# Patient Record
Sex: Female | Born: 1960 | Race: White | Hispanic: No | Marital: Single | Smoking: Former smoker
Health system: Southern US, Community
[De-identification: ages and names within clinical notes are randomized; demographics above are authoritative.]

## PROBLEM LIST (undated history)

## (undated) DIAGNOSIS — R112 Nausea with vomiting, unspecified: Secondary | ICD-10-CM

## (undated) DIAGNOSIS — N200 Calculus of kidney: Secondary | ICD-10-CM

## (undated) DIAGNOSIS — T7840XA Allergy, unspecified, initial encounter: Secondary | ICD-10-CM

## (undated) DIAGNOSIS — Z9889 Other specified postprocedural states: Secondary | ICD-10-CM

## (undated) DIAGNOSIS — F419 Anxiety disorder, unspecified: Secondary | ICD-10-CM

## (undated) DIAGNOSIS — I10 Essential (primary) hypertension: Secondary | ICD-10-CM

## (undated) DIAGNOSIS — I429 Cardiomyopathy, unspecified: Secondary | ICD-10-CM

## (undated) HISTORY — DX: Anxiety disorder, unspecified: F41.9

## (undated) HISTORY — PX: APPENDECTOMY: SHX54

## (undated) HISTORY — DX: Allergy, unspecified, initial encounter: T78.40XA

---

## 2014-02-17 ENCOUNTER — Emergency Department: Payer: Self-pay | Admitting: Emergency Medicine

## 2014-02-17 LAB — CBC
HCT: 41.5 % (ref 35.0–47.0)
HGB: 14.1 g/dL (ref 12.0–16.0)
MCH: 33.2 pg (ref 26.0–34.0)
MCHC: 34 g/dL (ref 32.0–36.0)
MCV: 98 fL (ref 80–100)
Platelet: 232 10*3/uL (ref 150–440)
RBC: 4.25 10*6/uL (ref 3.80–5.20)
RDW: 11.7 % (ref 11.5–14.5)
WBC: 13.7 10*3/uL — ABNORMAL HIGH (ref 3.6–11.0)

## 2014-02-17 LAB — COMPREHENSIVE METABOLIC PANEL
ALBUMIN: 4.3 g/dL (ref 3.4–5.0)
Alkaline Phosphatase: 59 U/L
Anion Gap: 1 — ABNORMAL LOW (ref 7–16)
BILIRUBIN TOTAL: 0.5 mg/dL (ref 0.2–1.0)
BUN: 11 mg/dL (ref 7–18)
CHLORIDE: 105 mmol/L (ref 98–107)
CO2: 32 mmol/L (ref 21–32)
Calcium, Total: 9.4 mg/dL (ref 8.5–10.1)
Creatinine: 0.87 mg/dL (ref 0.60–1.30)
EGFR (Non-African Amer.): >60
Glucose: 104 mg/dL — ABNORMAL HIGH (ref 65–99)
OSMOLALITY: 275 (ref 275–301)
Potassium: 3.5 mmol/L (ref 3.5–5.1)
SGOT(AST): 28 U/L (ref 15–37)
SGPT (ALT): 19 U/L (ref 12–78)
Sodium: 138 mmol/L (ref 136–145)
TOTAL PROTEIN: 7.8 g/dL (ref 6.4–8.2)

## 2014-02-17 LAB — URINALYSIS, COMPLETE
Bacteria: NONE SEEN
Bilirubin,UR: NEGATIVE
GLUCOSE, UR: NEGATIVE mg/dL (ref 0–75)
Ketone: NEGATIVE
NITRITE: NEGATIVE
PH: 6 (ref 4.5–8.0)
Protein: 100
SPECIFIC GRAVITY: 1.011 (ref 1.003–1.030)
Squamous Epithelial: NONE SEEN

## 2014-02-20 LAB — URINE CULTURE

## 2014-03-30 DIAGNOSIS — I429 Cardiomyopathy, unspecified: Secondary | ICD-10-CM | POA: Insufficient documentation

## 2014-05-23 ENCOUNTER — Ambulatory Visit: Payer: Self-pay | Admitting: Urology

## 2014-06-06 DIAGNOSIS — C649 Malignant neoplasm of unspecified kidney, except renal pelvis: Secondary | ICD-10-CM | POA: Insufficient documentation

## 2015-07-19 DIAGNOSIS — I471 Supraventricular tachycardia: Secondary | ICD-10-CM | POA: Insufficient documentation

## 2015-07-19 DIAGNOSIS — I429 Cardiomyopathy, unspecified: Secondary | ICD-10-CM | POA: Insufficient documentation

## 2015-07-19 DIAGNOSIS — I10 Essential (primary) hypertension: Secondary | ICD-10-CM | POA: Insufficient documentation

## 2016-12-22 ENCOUNTER — Encounter
Admission: EM | Disposition: A | Discharge status: Home / Self Care | Payer: Self-pay | Source: Home / Self Care | Attending: Internal Medicine

## 2016-12-22 ENCOUNTER — Emergency Department: Payer: BLUE CROSS/BLUE SHIELD

## 2016-12-22 ENCOUNTER — Observation Stay: Payer: BLUE CROSS/BLUE SHIELD | Admitting: Certified Registered Nurse Anesthetist

## 2016-12-22 ENCOUNTER — Inpatient Hospital Stay
Admission: EM | Admit: 2016-12-22 | Discharge: 2016-12-25 | DRG: 872 | Disposition: A | Discharge status: Home / Self Care | Payer: BLUE CROSS/BLUE SHIELD | Attending: Internal Medicine | Admitting: Internal Medicine

## 2016-12-22 DIAGNOSIS — Z885 Allergy status to narcotic agent status: Secondary | ICD-10-CM

## 2016-12-22 DIAGNOSIS — N136 Pyonephrosis: Secondary | ICD-10-CM | POA: Diagnosis present

## 2016-12-22 DIAGNOSIS — D3002 Benign neoplasm of left kidney: Secondary | ICD-10-CM | POA: Diagnosis present

## 2016-12-22 DIAGNOSIS — E871 Hypo-osmolality and hyponatremia: Secondary | ICD-10-CM | POA: Diagnosis present

## 2016-12-22 DIAGNOSIS — N201 Calculus of ureter: Secondary | ICD-10-CM

## 2016-12-22 DIAGNOSIS — N1330 Unspecified hydronephrosis: Secondary | ICD-10-CM | POA: Diagnosis present

## 2016-12-22 DIAGNOSIS — Z87442 Personal history of urinary calculi: Secondary | ICD-10-CM

## 2016-12-22 DIAGNOSIS — B9620 Unspecified Escherichia coli [E. coli] as the cause of diseases classified elsewhere: Secondary | ICD-10-CM | POA: Diagnosis present

## 2016-12-22 DIAGNOSIS — R109 Unspecified abdominal pain: Secondary | ICD-10-CM

## 2016-12-22 DIAGNOSIS — I471 Supraventricular tachycardia: Secondary | ICD-10-CM | POA: Diagnosis present

## 2016-12-22 DIAGNOSIS — A419 Sepsis, unspecified organism: Secondary | ICD-10-CM | POA: Diagnosis not present

## 2016-12-22 DIAGNOSIS — R011 Cardiac murmur, unspecified: Secondary | ICD-10-CM | POA: Diagnosis present

## 2016-12-22 DIAGNOSIS — F419 Anxiety disorder, unspecified: Secondary | ICD-10-CM | POA: Diagnosis present

## 2016-12-22 DIAGNOSIS — Z7983 Long term (current) use of bisphosphonates: Secondary | ICD-10-CM

## 2016-12-22 DIAGNOSIS — I429 Cardiomyopathy, unspecified: Secondary | ICD-10-CM | POA: Diagnosis present

## 2016-12-22 DIAGNOSIS — Z830 Family history of human immunodeficiency virus [HIV] disease: Secondary | ICD-10-CM

## 2016-12-22 DIAGNOSIS — N390 Urinary tract infection, site not specified: Secondary | ICD-10-CM | POA: Diagnosis present

## 2016-12-22 DIAGNOSIS — Z79899 Other long term (current) drug therapy: Secondary | ICD-10-CM

## 2016-12-22 DIAGNOSIS — I10 Essential (primary) hypertension: Secondary | ICD-10-CM | POA: Diagnosis present

## 2016-12-22 DIAGNOSIS — Z882 Allergy status to sulfonamides status: Secondary | ICD-10-CM

## 2016-12-22 DIAGNOSIS — Z808 Family history of malignant neoplasm of other organs or systems: Secondary | ICD-10-CM

## 2016-12-22 DIAGNOSIS — N132 Hydronephrosis with renal and ureteral calculous obstruction: Secondary | ICD-10-CM | POA: Diagnosis not present

## 2016-12-22 HISTORY — PX: CYSTOSCOPY WITH STENT PLACEMENT: SHX5790

## 2016-12-22 HISTORY — PX: CYSTOSCOPY W/ RETROGRADES: SHX1426

## 2016-12-22 HISTORY — DX: Cardiomyopathy, unspecified: I42.9

## 2016-12-22 HISTORY — DX: Calculus of kidney: N20.0

## 2016-12-22 LAB — CBC WITH DIFFERENTIAL/PLATELET
Basophils Absolute: 0.1 10*3/uL (ref 0–0.1)
Basophils Relative: 0 %
EOS ABS: 0 10*3/uL (ref 0–0.7)
EOS PCT: 0 %
HCT: 40.4 % (ref 35.0–47.0)
Hemoglobin: 14 g/dL (ref 12.0–16.0)
LYMPHS ABS: 2.6 10*3/uL (ref 1.0–3.6)
Lymphocytes Relative: 14 %
MCH: 32.4 pg (ref 26.0–34.0)
MCHC: 34.5 g/dL (ref 32.0–36.0)
MCV: 93.7 fL (ref 80.0–100.0)
MONO ABS: 0.7 10*3/uL (ref 0.2–0.9)
MONOS PCT: 4 %
Neutro Abs: 14.8 10*3/uL — ABNORMAL HIGH (ref 1.4–6.5)
Neutrophils Relative %: 82 %
PLATELETS: 261 10*3/uL (ref 150–440)
RBC: 4.31 MIL/uL (ref 3.80–5.20)
RDW: 12.3 % (ref 11.5–14.5)
WBC: 18.1 10*3/uL — ABNORMAL HIGH (ref 3.6–11.0)

## 2016-12-22 LAB — URINALYSIS, COMPLETE (UACMP) WITH MICROSCOPIC
Bilirubin Urine: NEGATIVE
GLUCOSE, UA: NEGATIVE mg/dL
KETONES UR: 5 mg/dL — AB
NITRITE: NEGATIVE
PROTEIN: 30 mg/dL — AB
Specific Gravity, Urine: 1.038 — ABNORMAL HIGH (ref 1.005–1.030)
pH: 5 (ref 5.0–8.0)

## 2016-12-22 LAB — COMPREHENSIVE METABOLIC PANEL
ALT: 19 U/L (ref 14–54)
AST: 26 U/L (ref 15–41)
Albumin: 4.8 g/dL (ref 3.5–5.0)
Alkaline Phosphatase: 44 U/L (ref 38–126)
Anion gap: 11 (ref 5–15)
BUN: 19 mg/dL (ref 6–20)
CHLORIDE: 100 mmol/L — AB (ref 101–111)
CO2: 22 mmol/L (ref 22–32)
Calcium: 9.4 mg/dL (ref 8.9–10.3)
Creatinine, Ser: 0.71 mg/dL (ref 0.44–1.00)
GFR calc Af Amer: >60 mL/min (ref >60)
Glucose, Bld: 150 mg/dL — ABNORMAL HIGH (ref 65–99)
POTASSIUM: 3.5 mmol/L (ref 3.5–5.1)
SODIUM: 133 mmol/L — AB (ref 135–145)
Total Bilirubin: 0.8 mg/dL (ref 0.3–1.2)
Total Protein: 7.4 g/dL (ref 6.5–8.1)

## 2016-12-22 SURGERY — CYSTOSCOPY, WITH RETROGRADE PYELOGRAM
Anesthesia: General | Laterality: Right | Wound class: Clean Contaminated

## 2016-12-22 MED ORDER — OXYCODONE HCL 5 MG PO TABS
5.0000 mg | ORAL_TABLET | ORAL | Status: DC | PRN
  Administered 2016-12-22 – 2016-12-25 (×6): 5 mg via ORAL
  Filled 2016-12-22 (×6): qty 1

## 2016-12-22 MED ORDER — ONDANSETRON HCL 4 MG/2ML IJ SOLN
INTRAMUSCULAR | Status: AC
  Filled 2016-12-22: qty 2

## 2016-12-22 MED ORDER — DEXTROSE 5 % IV SOLN
1.0000 g | INTRAVENOUS | Status: DC
  Administered 2016-12-23 – 2016-12-25 (×3): 1 g via INTRAVENOUS
  Filled 2016-12-22 (×4): qty 10

## 2016-12-22 MED ORDER — MIDAZOLAM HCL 2 MG/2ML IJ SOLN
INTRAMUSCULAR | Status: AC
  Filled 2016-12-22: qty 2

## 2016-12-22 MED ORDER — PROPOFOL 10 MG/ML IV BOLUS
INTRAVENOUS | Status: DC | PRN
  Administered 2016-12-22: 200 mg via INTRAVENOUS

## 2016-12-22 MED ORDER — MIDAZOLAM HCL 2 MG/2ML IJ SOLN
INTRAMUSCULAR | Status: DC | PRN
  Administered 2016-12-22: 2 mg via INTRAVENOUS

## 2016-12-22 MED ORDER — PHENYLEPHRINE HCL 10 MG/ML IJ SOLN
INTRAMUSCULAR | Status: DC | PRN
  Administered 2016-12-22 (×2): 100 ug via INTRAVENOUS

## 2016-12-22 MED ORDER — SODIUM CHLORIDE 0.9 % IV SOLN
INTRAVENOUS | Status: DC
  Administered 2016-12-22 – 2016-12-23 (×4): via INTRAVENOUS

## 2016-12-22 MED ORDER — ONDANSETRON HCL 4 MG/2ML IJ SOLN
4.0000 mg | Freq: Once | INTRAMUSCULAR | Status: AC
  Administered 2016-12-22: 4 mg via INTRAVENOUS

## 2016-12-22 MED ORDER — SUCCINYLCHOLINE CHLORIDE 20 MG/ML IJ SOLN
INTRAMUSCULAR | Status: DC | PRN
  Administered 2016-12-22: 100 mg via INTRAVENOUS

## 2016-12-22 MED ORDER — LOSARTAN POTASSIUM 50 MG PO TABS
50.0000 mg | ORAL_TABLET | Freq: Every day | ORAL | Status: DC
  Filled 2016-12-22: qty 1

## 2016-12-22 MED ORDER — PROMETHAZINE HCL 25 MG/ML IJ SOLN: 6.2500 mg | INTRAMUSCULAR | Status: DC | PRN

## 2016-12-22 MED ORDER — SODIUM CHLORIDE 0.9 % IV BOLUS (SEPSIS)
1000.0000 mL | Freq: Once | INTRAVENOUS | Status: AC
  Administered 2016-12-22: 1000 mL via INTRAVENOUS

## 2016-12-22 MED ORDER — CIPROFLOXACIN IN D5W 400 MG/200ML IV SOLN
400.0000 mg | Freq: Once | INTRAVENOUS | Status: DC
  Filled 2016-12-22: qty 200

## 2016-12-22 MED ORDER — DEXAMETHASONE SODIUM PHOSPHATE 10 MG/ML IJ SOLN
INTRAMUSCULAR | Status: AC
  Filled 2016-12-22: qty 1

## 2016-12-22 MED ORDER — ONDANSETRON HCL 4 MG/2ML IJ SOLN
INTRAMUSCULAR | Status: AC
  Administered 2016-12-22: 4 mg via INTRAVENOUS
  Filled 2016-12-22: qty 2

## 2016-12-22 MED ORDER — ONDANSETRON HCL 4 MG/2ML IJ SOLN
INTRAMUSCULAR | Status: DC | PRN
Start: 2016-12-22 — End: 2016-12-22
  Administered 2016-12-22: 4 mg via INTRAVENOUS

## 2016-12-22 MED ORDER — ONDANSETRON HCL 4 MG PO TABS: 4.0000 mg | ORAL_TABLET | Freq: Four times a day (QID) | ORAL | Status: DC | PRN

## 2016-12-22 MED ORDER — FENTANYL CITRATE (PF) 100 MCG/2ML IJ SOLN
100.0000 ug | INTRAMUSCULAR | Status: DC | PRN
Start: 2016-12-22 — End: 2016-12-23
  Administered 2016-12-22: 100 ug via INTRAVENOUS
  Filled 2016-12-22: qty 2

## 2016-12-22 MED ORDER — SENNOSIDES-DOCUSATE SODIUM 8.6-50 MG PO TABS
1.0000 | ORAL_TABLET | Freq: Every evening | ORAL | Status: DC | PRN
Start: 2016-12-22 — End: 2016-12-25

## 2016-12-22 MED ORDER — KETAMINE HCL 10 MG/ML IJ SOLN: 0.2000 mg/kg | Freq: Once | INTRAMUSCULAR | Status: DC

## 2016-12-22 MED ORDER — ALBUTEROL SULFATE (2.5 MG/3ML) 0.083% IN NEBU: 2.5000 mg | INHALATION_SOLUTION | RESPIRATORY_TRACT | Status: DC | PRN

## 2016-12-22 MED ORDER — KETOROLAC TROMETHAMINE 30 MG/ML IJ SOLN
15.0000 mg | Freq: Once | INTRAMUSCULAR | Status: AC
  Administered 2016-12-22: 15 mg via INTRAVENOUS

## 2016-12-22 MED ORDER — CEFTRIAXONE SODIUM-DEXTROSE 1-3.74 GM-% IV SOLR
1.0000 g | INTRAVENOUS | Status: AC
  Administered 2016-12-22: 1 g via INTRAVENOUS
  Filled 2016-12-22: qty 50

## 2016-12-22 MED ORDER — IOTHALAMATE MEGLUMINE 43 % IV SOLN
INTRAVENOUS | Status: DC | PRN
  Administered 2016-12-22: 15 mL

## 2016-12-22 MED ORDER — FENTANYL CITRATE (PF) 100 MCG/2ML IJ SOLN
INTRAMUSCULAR | Status: AC
  Filled 2016-12-22: qty 2

## 2016-12-22 MED ORDER — GENTAMICIN SULFATE 40 MG/ML IJ SOLN
5.0000 mg/kg | INTRAVENOUS | Status: DC
  Administered 2016-12-22 – 2016-12-24 (×3): 360 mg via INTRAVENOUS
  Filled 2016-12-22 (×4): qty 9

## 2016-12-22 MED ORDER — KETOROLAC TROMETHAMINE 30 MG/ML IJ SOLN
30.0000 mg | Freq: Four times a day (QID) | INTRAMUSCULAR | Status: DC | PRN
  Administered 2016-12-22: 30 mg via INTRAVENOUS
  Filled 2016-12-22: qty 1

## 2016-12-22 MED ORDER — DEXTROSE 5 % IV SOLN: 1.0000 g | INTRAVENOUS | Status: DC

## 2016-12-22 MED ORDER — CARVEDILOL 6.25 MG PO TABS
6.2500 mg | ORAL_TABLET | Freq: Two times a day (BID) | ORAL | Status: DC
  Administered 2016-12-22: 6.25 mg via ORAL
  Filled 2016-12-22 (×2): qty 1

## 2016-12-22 MED ORDER — LIDOCAINE HCL (CARDIAC) 20 MG/ML IV SOLN
INTRAVENOUS | Status: DC | PRN
  Administered 2016-12-22: 100 mg via INTRAVENOUS

## 2016-12-22 MED ORDER — FENTANYL CITRATE (PF) 100 MCG/2ML IJ SOLN: 25.0000 ug | INTRAMUSCULAR | Status: DC | PRN

## 2016-12-22 MED ORDER — KETOROLAC TROMETHAMINE 30 MG/ML IJ SOLN
INTRAMUSCULAR | Status: AC
  Administered 2016-12-22: 15 mg via INTRAVENOUS
  Filled 2016-12-22: qty 1

## 2016-12-22 MED ORDER — MORPHINE SULFATE (PF) 4 MG/ML IV SOLN
4.0000 mg | INTRAVENOUS | Status: DC | PRN
  Administered 2016-12-22 – 2016-12-23 (×3): 4 mg via INTRAVENOUS
  Filled 2016-12-22 (×3): qty 1

## 2016-12-22 MED ORDER — FENTANYL CITRATE (PF) 100 MCG/2ML IJ SOLN
INTRAMUSCULAR | Status: DC | PRN
  Administered 2016-12-22: 100 ug via INTRAVENOUS

## 2016-12-22 MED ORDER — ONDANSETRON HCL 4 MG/2ML IJ SOLN
4.0000 mg | Freq: Four times a day (QID) | INTRAMUSCULAR | Status: DC | PRN
  Administered 2016-12-23: 4 mg via INTRAVENOUS
  Filled 2016-12-22: qty 2

## 2016-12-22 MED ORDER — PROPOFOL 10 MG/ML IV BOLUS
INTRAVENOUS | Status: AC
  Filled 2016-12-22: qty 20

## 2016-12-22 MED ORDER — LIDOCAINE HCL (PF) 2 % IJ SOLN
INTRAMUSCULAR | Status: AC
  Filled 2016-12-22: qty 2

## 2016-12-22 MED ORDER — DEXAMETHASONE SODIUM PHOSPHATE 10 MG/ML IJ SOLN
INTRAMUSCULAR | Status: DC | PRN
  Administered 2016-12-22: 10 mg via INTRAVENOUS

## 2016-12-22 SURGICAL SUPPLY — 20 items
BAG DRAIN CYSTO-URO LG1000N (MISCELLANEOUS) ×2 IMPLANT
CATH FOL 2WAY LX 16X5 (CATHETERS) ×2 IMPLANT
CATH URETL 5X70 OPEN END (CATHETERS) ×4 IMPLANT
CONRAY 43 FOR UROLOGY 50M (MISCELLANEOUS) ×2 IMPLANT
GLOVE BIO SURGEON STRL SZ 6.5 (GLOVE) ×2 IMPLANT
GOWN STRL REUS W/ TWL LRG LVL3 (GOWN DISPOSABLE) ×2 IMPLANT
GOWN STRL REUS W/TWL LRG LVL3 (GOWN DISPOSABLE) ×2
KIT RM TURNOVER CYSTO AR (KITS) ×2 IMPLANT
PACK CYSTO AR (MISCELLANEOUS) ×2 IMPLANT
SCRUB POVIDONE IODINE 4 OZ (MISCELLANEOUS) IMPLANT
SENSORWIRE 0.038 NOT ANGLED (WIRE) ×2
SET CYSTO W/LG BORE CLAMP LF (SET/KITS/TRAYS/PACK) ×2 IMPLANT
SOL .9 NS 3000ML IRR  AL (IV SOLUTION) ×1
SOL .9 NS 3000ML IRR UROMATIC (IV SOLUTION) ×1 IMPLANT
STENT URET 6FRX24 CONTOUR (STENTS) ×2 IMPLANT
STENT URET 6FRX26 CONTOUR (STENTS) IMPLANT
SURGILUBE 2OZ TUBE FLIPTOP (MISCELLANEOUS) ×2 IMPLANT
SYRINGE IRR TOOMEY STRL 70CC (SYRINGE) ×2 IMPLANT
WATER STERILE IRR 1000ML POUR (IV SOLUTION) ×2 IMPLANT
WIRE SENSOR 0.038 NOT ANGLED (WIRE) ×1 IMPLANT

## 2016-12-22 NOTE — Anesthesia Preprocedure Evaluation (Signed)
Anesthesia Evaluation  Patient identified by MRN, date of birth, ID band Patient awake    Reviewed: Allergy & Precautions, H&P , NPO status , Patient's Chart, lab work & pertinent test results, reviewed documented beta blocker date and time   History of Anesthesia Complications Negative for: history of anesthetic complications  Airway Mallampati: II  TM Distance: >3 FB Neck ROM: full    Dental  (+) Missing, Caps   Pulmonary neg pulmonary ROS,           Cardiovascular Exercise Tolerance: Good hypertension, (-) angina(-) CAD, (-) Past MI, (-) Cardiac Stents and (-) CABG + dysrhythmias Supra Ventricular Tachycardia + Valvular Problems/Murmurs   Non-ischemic cardiomyopathy, EF in 1/17 was 45-50%   Neuro/Psych PSYCHIATRIC DISORDERS (Anxiety) negative neurological ROS     GI/Hepatic negative GI ROS, Neg liver ROS,   Endo/Other  negative endocrine ROS  Renal/GU Renal disease (kidney stones)  negative genitourinary   Musculoskeletal   Abdominal   Peds  Hematology negative hematology ROS (+)   Anesthesia Other Findings Past Medical History: No date: Cardiomyopathy ([place]) No date: Kidney stone   Reproductive/Obstetrics negative OB ROS                             Anesthesia Physical Anesthesia Plan  ASA: II and emergent  Anesthesia Plan: General ETT, Rapid Sequence and Cricoid Pressure   Post-op Pain Management:    Induction:   Airway Management Planned:   Additional Equipment:   Intra-op Plan:   Post-operative Plan:   Informed Consent: I have reviewed the patients History and Physical, chart, labs and discussed the procedure including the risks, benefits and alternatives for the proposed anesthesia with the patient or authorized representative who has indicated his/her understanding and acceptance.   Dental Advisory Given  Plan Discussed with: Anesthesiologist, CRNA and  Surgeon  Anesthesia Plan Comments:         Anesthesia Quick Evaluation

## 2016-12-22 NOTE — ED Provider Notes (Signed)
[facility] Emergency Department Provider Note    First MD Initiated Contact with Patient 12/22/16 [phone]     (approximate)  I have reviewed the triage vital signs and the nursing notes.   HISTORY  Chief Complaint Flank Pain    HPI Kim Riley is a 56 y.o. female with a history of kidney stones presents with acute right-sided flank pain that started at 3 AM associated with nausea. Patient has a history of kidney stones. She denies any shortness of breath or chest pain. Has not had any vomiting. States the pain is currently 10 out of 10 in severity. It woke her up from sleep and is only about a 6 out of 10. Pain does not radiate through to her abdomen. It is radiating down her right groin. States it feels similar to previous kidney stones.   Past Medical History:  Diagnosis Date  . Cardiomyopathy ([place])   . Kidney stone    [place]: no bleeding disorders History reviewed. No pertinent surgical history. There are no active problems to display for this patient.     Prior to Admission medications   Not on File    Allergies Vicodin [hydrocodone-acetaminophen] and Sulfa antibiotics    Social History Social History  Substance Use Topics  . Smoking status: Never Smoker  . Smokeless tobacco: Never Used  . Alcohol use No    Review of Systems Patient denies headaches, rhinorrhea, blurry vision, numbness, shortness of breath, chest pain, edema, cough, abdominal pain, nausea, vomiting, diarrhea, dysuria, fevers, rashes or hallucinations unless otherwise stated above in HPI. ____________________________________________   PHYSICAL EXAM:  VITAL SIGNS: Vitals:   12/22/16 0718  BP: (!) 168/45  Pulse: 79  Resp: (!) 28    Constitutional: Alert and oriented. Uncomfortable appearing and ill appearing but in no acute distress. Eyes: Conjunctivae are normal. PERRL. EOMI. Head: Atraumatic. Nose: No congestion/rhinnorhea. Mouth/Throat: Mucous  membranes are moist.  Oropharynx non-erythematous. Neck: No stridor. Painless ROM. No cervical spine tenderness to palpation Hematological/Lymphatic/Immunilogical: No cervical lymphadenopathy. Cardiovascular: Normal rate, regular rhythm. Grossly normal heart sounds.  Good peripheral circulation. Respiratory: Normal respiratory effort.  No retractions. Lungs CTAB. Gastrointestinal: Soft and nontender. No distention. No abdominal bruits. + right cva tenderness. Musculoskeletal: No lower extremity tenderness nor edema.  No joint effusions. Neurologic:  Normal speech and language. No gross focal neurologic deficits are appreciated. No gait instability. Skin:  Skin is warm, dry and intact. No rash noted. Psychiatric: Mood and affect are normal. Speech and behavior are normal.  ____________________________________________   LABS (all labs ordered are listed, but only abnormal results are displayed)  Results for orders placed or performed during the hospital encounter of 12/22/16 (from the past 24 hour(s))  CBC with Differential/Platelet     Status: Abnormal   Collection Time: 12/22/16  7:31 AM  Result Value Ref Range   WBC 18.1 (H) 3.6 - 11.0 K/uL   RBC 4.31 3.80 - 5.20 MIL/uL   Hemoglobin 14.0 12.0 - 16.0 g/dL   HCT 40.4 35.0 - 47.0 %   MCV 93.7 80.0 - 100.0 fL   MCH 32.4 26.0 - 34.0 pg   MCHC 34.5 32.0 - 36.0 g/dL   RDW 12.3 11.5 - 14.5 %   Platelets 261 150 - 440 K/uL   Neutrophils Relative % 82 %   Neutro Abs 14.8 (H) 1.4 - 6.5 K/uL   Lymphocytes Relative 14 %   Lymphs Abs 2.6 1.0 - 3.6 K/uL   Monocytes Relative 4 %  Monocytes Absolute 0.7 0.2 - 0.9 K/uL   Eosinophils Relative 0 %   Eosinophils Absolute 0.0 0 - 0.7 K/uL   Basophils Relative 0 %   Basophils Absolute 0.1 0 - 0.1 K/uL  Comprehensive metabolic panel     Status: Abnormal   Collection Time: 12/22/16  7:31 AM  Result Value Ref Range   Sodium 133 (L) 135 - 145 mmol/L   Potassium 3.5 3.5 - 5.1 mmol/L   Chloride 100  (L) 101 - 111 mmol/L   CO2 22 22 - 32 mmol/L   Glucose, Bld 150 (H) 65 - 99 mg/dL   BUN 19 6 - 20 mg/dL   Creatinine, Ser 0.71 0.44 - 1.00 mg/dL   Calcium 9.4 8.9 - 10.3 mg/dL   Total Protein 7.4 6.5 - 8.1 g/dL   Albumin 4.8 3.5 - 5.0 g/dL   AST 26 15 - 41 U/L   ALT 19 14 - 54 U/L   Alkaline Phosphatase 44 38 - 126 U/L   Total Bilirubin 0.8 0.3 - 1.2 mg/dL   GFR calc non Af Amer >60 >60 mL/min   GFR calc Af Amer >60 >60 mL/min   Anion gap 11 5 - 15  Urinalysis, Complete w Microscopic     Status: Abnormal   Collection Time: 12/22/16  7:56 AM  Result Value Ref Range   Color, Urine YELLOW (A) YELLOW   APPearance HAZY (A) CLEAR   Specific Gravity, Urine 1.038 (H) 1.005 - 1.030   pH 5.0 5.0 - 8.0   Glucose, UA NEGATIVE NEGATIVE mg/dL   Hgb urine dipstick MODERATE (A) NEGATIVE   Bilirubin Urine NEGATIVE NEGATIVE   Ketones, ur 5 (A) NEGATIVE mg/dL   Protein, ur 30 (A) NEGATIVE mg/dL   Nitrite NEGATIVE NEGATIVE   Leukocytes, UA SMALL (A) NEGATIVE   RBC / HPF 6-30 0 - 5 RBC/hpf   WBC, UA 6-30 0 - 5 WBC/hpf   Bacteria, UA MANY (A) NONE SEEN   Squamous Epithelial / LPF 0-5 (A) NONE SEEN   Mucous PRESENT    ____________________________________________ ____________________________________________  RADIOLOGY  I personally reviewed all radiographic images ordered to evaluate for the above acute complaints and reviewed radiology reports and findings.  These findings were personally discussed with the patient.  Please see medical record for radiology report.  ____________________________________________   PROCEDURES  Procedure(s) performed:  Procedures    Critical Care performed: no ____________________________________________   INITIAL IMPRESSION / ASSESSMENT AND PLAN / ED COURSE  Pertinent labs & imaging results that were available during my care of the patient were reviewed by me and considered in my medical decision making (see chart for details).  Stone, pyelo,  uropathy, dissection  Romie May Klaus is a 56 y.o. who presents to the ED with Hx of stones p/w right flank pain. No fevers,  + chills, no systemic symptoms. - urinary symptoms. Denies trauma or injury. Afebrile in ED. Exam as above. Flank TTP, otherwise abdominal exam is benign. No peritoneal signs. Possible kidney stone, cystitis, or pyelonephritis.   Checking urine. UA with many bacteria, + leuko, - nitrites CT Stone with 1cm stone with hydro on the right. Clinical picture is not consistent with appendicitis, diverticulitis, pancreatitis, cholecystitis, bowel perforation, aortic dissection, splenic injury or acute abdominal process at this time.  Clinical Course as of Dec 22 1117  Mon Dec 22, 2016  0847 Pain not improved.  Will give dose of antibiotics for concern for obstructive uropathy with bacturia.  Elevated wbc to  18 with left shift.  Currently afebrile and normotensive but is ill appearing.    [PR]  [phone] I spoke with Dr. Junious Silk of urology  regarding concern for obstructing and possible infected stone. He has reviewed imaging and agrees if pain, not improved, will need admission for pain control and probable stent.   [PR]  1105 Patient reassessed. States that currently the pain is 1010 in severity and the patient does still appear ill. She has received antibiotics. I will call the hospitalist regarding admission for pain control as well as consultation with urology for possible procedural intervention and further evaluation.  Have discussed with the patient and available family all diagnostics and treatments performed thus far and all questions were answered to the best of my ability. The patient demonstrates understanding and agreement with plan.   [PR]    Clinical Course User Index [PR] Merlyn Lot, MD     ____________________________________________   FINAL CLINICAL IMPRESSION(S) / ED DIAGNOSES  Final diagnoses:  Right flank pain  Ureterolithiasis  Hydronephrosis,  unspecified hydronephrosis type      NEW MEDICATIONS STARTED DURING THIS VISIT:  New Prescriptions   No medications on file     Note:  This document was prepared using Dragon voice recognition software and may include unintentional dictation errors.    Merlyn Lot, MD 12/22/16 1121

## 2016-12-22 NOTE — Anesthesia Post-op Follow-up Note (Cosign Needed)
Anesthesia QCDR form completed.        

## 2016-12-22 NOTE — ED Notes (Signed)
Pt up to toilet in room to have a BM.

## 2016-12-22 NOTE — ED Triage Notes (Signed)
Pt c/o right flank pain since 3am with nausea with a hx of kidney stones.

## 2016-12-22 NOTE — Anesthesia Procedure Notes (Signed)
Procedure Name: Intubation Date/Time: 12/22/2016 2:02 PM Performed by: Darlyne Russian Pre-anesthesia Checklist: Patient identified, Emergency Drugs available, Suction available, Patient being monitored and Timeout performed Patient Re-evaluated:Patient Re-evaluated prior to inductionOxygen Delivery Method: Circle system utilized Preoxygenation: Pre-oxygenation with 100% oxygen Intubation Type: IV induction, Cricoid Pressure applied and Rapid sequence Laryngoscope Size: Mac and 3 Grade View: Grade III Tube type: Oral Number of attempts: 1 Airway Equipment and Method: Stylet Placement Confirmation: ETT inserted through vocal cords under direct vision,  positive ETCO2 and breath sounds checked- equal and bilateral Secured at: 22 cm Tube secured with: Tape Dental Injury: Teeth and Oropharynx as per pre-operative assessment

## 2016-12-22 NOTE — H&P (Signed)
[facility] NAME: Kim Riley    MR#:  [id number]  DATE OF BIRTH:  December 01, 1960  DATE OF ADMISSION:  12/22/2016  PRIMARY CARE PHYSICIAN: Vista Mink, FNP   REQUESTING/REFERRING PHYSICIAN: Merlyn Lot, MD  CHIEF COMPLAINT:   Chief Complaint  Patient presents with  . Flank Pain   Abdominal and flank pain since last night. HISTORY OF PRESENT ILLNESS:  Kim Riley  is a 56 y.o. female with a known history of Kidney stone in the current biopsy. The patient presents to the ED with abdominal pain and flank pain since last night, associated with nausea, vomiting and diarrhea. Patient also complains of fever and chills. CAT scan of abdomen and pelvis showed right side hydronephrosis with 1 cm kidney stone. ED physician discussed with on-call urologist, who suggested stent placement today.  PAST MEDICAL HISTORY:   Past Medical History:  Diagnosis Date  . Cardiomyopathy ([place])   . Kidney stone     PAST SURGICAL HISTORY:  History reviewed. No pertinent surgical history.  SOCIAL HISTORY:   Social History  Substance Use Topics  . Smoking status: Never Smoker  . Smokeless tobacco: Never Used  . Alcohol use No    FAMILY HISTORY:   Family History  Problem Relation Age of Onset  . HIV Mother   . Brain cancer Father     DRUG ALLERGIES:   Allergies  Allergen Reactions  . Vicodin [Hydrocodone-Acetaminophen]   . Sulfa Antibiotics Rash    REVIEW OF SYSTEMS:   Review of Systems  Constitutional: Positive for chills, fever and malaise/fatigue.  HENT: Negative for congestion.   Eyes: Negative for double vision.  Respiratory: Negative for cough, shortness of breath and stridor.   Cardiovascular: Negative for chest pain and leg swelling.  Gastrointestinal: Positive for abdominal pain, diarrhea, nausea and vomiting. Negative for blood in stool, constipation and melena.  Genitourinary: Positive for  dysuria, flank pain, frequency and urgency. Negative for hematuria.  Musculoskeletal: Positive for back pain.  Skin: Negative for itching and rash.  Neurological: Positive for weakness. Negative for dizziness, focal weakness and loss of consciousness.  Psychiatric/Behavioral: Negative for depression. The patient is not nervous/anxious.     MEDICATIONS AT HOME:   Prior to Admission medications   Medication Sig Start Date End Date Taking? Authorizing Provider  alendronate (FOSAMAX) 70 MG tablet Take 1 tablet by mouth once a week. 12/16/16  Yes Historical Provider, MD  carvedilol (COREG) 6.25 MG tablet Take 1 tablet by mouth 2 (two) times daily. 12/10/16  Yes Historical Provider, MD  losartan (COZAAR) 50 MG tablet Take 1 tablet by mouth daily. 12/20/16  Yes Historical Provider, MD      VITAL SIGNS:  Blood pressure (!) 144/101, pulse 76, temperature 97.6 F (36.4 C), temperature source Oral, resp. rate (!) 24, height 5\' 8"  (1.727 m), weight 160 lb (72.6 kg), SpO2 100 %.  PHYSICAL EXAMINATION:  Physical Exam  GENERAL:  56 y.o.-year-old patient lying in the bed with no acute distress.  EYES: Pupils equal, round, reactive to light and accommodation. No scleral icterus. Extraocular muscles intact.  HEENT: Head atraumatic, normocephalic. Oropharynx and nasopharynx clear.  NECK:  Supple, no jugular venous distention. No thyroid enlargement, no tenderness.  LUNGS: Normal breath sounds bilaterally, no wheezing, rales,rhonchi or crepitation. No use of accessory muscles of respiration.  CARDIOVASCULAR: S1, S2 normal. No murmurs, rubs, or gallops.  ABDOMEN: Soft, nontender, nondistended. Bowel sounds present. No organomegaly or mass. Right  side CVA tenderness. EXTREMITIES: No pedal edema, cyanosis, or clubbing.  NEUROLOGIC: Cranial nerves II through XII are intact. Muscle strength 5/5 in all extremities. Sensation intact. Gait not checked.  PSYCHIATRIC: The patient is alert and oriented x 3.  SKIN:  No obvious rash, lesion, or ulcer.   LABORATORY PANEL:   CBC  Recent Labs Lab 12/22/16 0731  WBC 18.1*  HGB 14.0  HCT 40.4  PLT 261   ------------------------------------------------------------------------------------------------------------------  Chemistries   Recent Labs Lab 12/22/16 0731  NA 133*  K 3.5  CL 100*  CO2 22  GLUCOSE 150*  BUN 19  CREATININE 0.71  CALCIUM 9.4  AST 26  ALT 19  ALKPHOS 44  BILITOT 0.8   ------------------------------------------------------------------------------------------------------------------  Cardiac Enzymes No results for input(s): TROPONINI in the last 168 hours. ------------------------------------------------------------------------------------------------------------------  RADIOLOGY:  Ct Renal Stone Study  Result Date: 12/22/2016 CLINICAL DATA:  Right flank and right lower quadrant abdominal pain beginning at 3 a.m. Dysuria. EXAM: CT ABDOMEN AND PELVIS WITHOUT CONTRAST TECHNIQUE: Multidetector CT imaging of the abdomen and pelvis was performed following the standard protocol without IV contrast. COMPARISON:  MRI of the abdomen 05/23/2014. FINDINGS: Lower chest: The lung bases are clear without focal nodule, mass, or airspace disease. The heart size is normal. No significant pleural or pericardial effusion is present. Hepatobiliary: No focal liver abnormality is seen. No gallstones, gallbladder wall thickening, or biliary dilatation. Pancreas: Unremarkable. No pancreatic ductal dilatation or surrounding inflammatory changes. Spleen: Normal in size without focal abnormality. Adrenals/Urinary Tract: The adrenal glands are normal bilaterally. Severe right-sided hydronephrosis is present. Right kidney is hypodense. There is marked dilation of the ureter to the level of an obstructing 10 mm stone in the distal ureter, just below the pelvic inlet. There is some scarring at the right lower pole. At least 4 additional nonobstructing  stones at the right lower pole are noted. The largest measures 4.5 mm. There are no stones on the right. Several mass lesions are evident. 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 An ill-defined exophytic nodule along the central sinus is stable measuring 1.8 x 1.5 cm. This has been previously evaluated by MRI. The left ureter is normal. The urinary bladder is unremarkable. Stomach/Bowel: A small hiatal hernia is present. Stomach and duodenum are otherwise unremarkable. Small bowel is unremarkable. The appendix is not discretely visualized and may be surgically absent. There are no inflammatory changes about the cecum. The ascending and transverse colon are within normal limits. The descending and sigmoid colon are normal as well. Vascular/Lymphatic: Atherosclerotic calcifications are present in the aorta and branch vessels without aneurysm. No significant adenopathy is present. Reproductive: Uterus and bilateral adnexa are unremarkable. Other: No abdominal wall hernia or abnormality. No abdominopelvic ascites. Musculoskeletal: Degenerative changes are noted at the SI joints and lower lumbar facets. No focal lytic or blastic lesions are present. IMPRESSION: 1. Severe right-sided hydroureteronephrosis with an obstructing 10 mm stone in the distal ureter. 2. Additional nonobstructing stones at the lower pole of the right kidney measure up to 4 point 5 mm. 3. Stable lesions in the left kidney. 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 4. 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 Electronically Signed   By: San Morelle M.D.   On: 12/22/2016 08:11      IMPRESSION AND PLAN:   Right-sided Hydroureteronephrosis due to nephrolithiasis. The patient will be placed for observation. Follow-up urology for stent placement. Nothing by mouth with  IV fluid support, pain control and Zofran when necessary.  UTI with leukocytosis. Continue Rocephin, follow-up  cultures.  Hyponatremia. Normal saline IV and follow-up BMP  Hypertension. Continue losartan and Coreg.  All the records are reviewed and case discussed with ED provider. Management plans discussed with the patient, family and they are in agreement.  CODE STATUS: Full code  TOTAL TIME TAKING CARE OF THIS PATIENT:47 minutes.    Demetrios Loll M.D on 12/22/2016 at 11:30 AM  Between 7am to 6pm - Pager - [phone]  After 6pm go to [email or web address] - Proofreader  [facility]  Office  [phone]  CC: Primary care physician; Vista Mink, FNP   Note: This dictation was prepared with Dragon dictation along with smaller phrase technology. Any transcriptional errors that result from this process are unintentional.

## 2016-12-22 NOTE — Op Note (Signed)
Date of procedure: 12/22/16  Preoperative diagnosis:  1. Right ureteral stone 2. Sepsis secondary to urinary source  Postoperative diagnosis:  1. Same as above   Procedure: 3. Cystoscopy 4. Right retrograde pyelogram 5. Right ureteral stent placement   Surgeon: Hollice Espy, MD  Anesthesia: General  Complications: None  Intraoperative findings: 1 cm right distal ureteral stone completely obstructing ureter on retrograde pyelogram. Stent placed without difficulty.  EBL: Minimal  Specimens: Right renal urine culture  Drains: 6 x 24 French double-J ureteral stent on right, 16 French Foley catheter.  Indication: Kim Riley is a 56 y.o. patient with presenting with an obstructing right distal ureteral stone with severe hydroureteronephrosis, leukocytosis, and fever in the preop holding area concerning for sepsis secondary to urinary source.  After reviewing the management options for treatment, she elected to proceed with the above surgical procedure(s). We have discussed the potential benefits and risks of the procedure, side effects of the proposed treatment, the likelihood of the patient achieving the goals of the procedure, and any potential problems that might occur during the procedure or recuperation. Informed consent has been obtained.  Description of procedure:  The patient was taken to the operating room and general anesthesia was induced.  The patient was placed in the dorsal lithotomy position, prepped and draped in the usual sterile fashion, and preoperative antibiotics were administered. A preoperative time-out was performed.   A 21 French scope was advanced per urethra into the bladder. Attention was turned to the right ureteral orifice which was cannulated using a 5 [place] open-ended ureteral catheter just within the UO. Gentle retrograde pyelogram was performed which showed contrast up to level of the stone but no contrast above this level concerning for  high-grade/complete obstruction. The wire was then able to be carefully advanced around the stone up to level of the kidney without difficulty. A 5 French open-ended ureteral catheter was then advanced up to level of the renal pelvis and the wire was withdrawn. Urine was aspirated from the right collecting system which was dark orange in color without significant purulence. This was sent off for culture. The wire was then replaced and the open-ended catheter was removed. A 6 x 24 French double-J ureteral stent was then advanced up to level of the kidney under fluoroscopic guidance. The wire was partially withdrawn until full coil was noted within the renal pelvis. The wire was then fully withdrawn and a full coil was noted within the bladder. The bladder was then drained. The scope was removed. A 16 French Foley catheter was then placed in the balloon was inflated with 10 cc of sterile water.  The patient was then cleaned and dried, repositioned supine position, reversed from anesthesia, taken the PACU in stable condition.   Plan: Continue IV antibiotics. We will likely remove the catheter in the morning if she is clinically improving. Please broaden antibiotics overnight and if the patient continues to spike fevers and obtain blood cultures.  Hollice Espy, M.D.

## 2016-12-22 NOTE — Transfer of Care (Signed)
Immediate Anesthesia Transfer of Care Note  Patient: [name]  Procedure(s) Performed: Procedure(s): CYSTOSCOPY WITH RETROGRADE PYELOGRAM (Right) CYSTOSCOPY WITH STENT PLACEMENT (Right)  Patient Location: PACU  Anesthesia Type:General  Level of Consciousness: patient cooperative and responds to stimulation  Airway & Oxygen Therapy: Patient Spontanous Breathing and Patient connected to nasal cannula oxygen  Post-op Assessment: Report given to RN and Post -op Vital signs reviewed and stable  Post vital signs: Reviewed and stable  Last Vitals:  Vitals:   12/22/16 1338 12/22/16 1437  BP: 126/84 125/75  Pulse:  (!) 104  Resp: (!) 22 17  Temp: (!) 38.4 C (!) 38.8 C    Last Pain:  Vitals:   12/22/16 1338  TempSrc: Oral  PainSc: 5       Patients Stated Pain Goal: 2 (21/30/86 5784)  Complications: No apparent anesthesia complications

## 2016-12-22 NOTE — ED Notes (Signed)
Ketamine not available in pharmacy, MD informed. Pt more comfortable, breathing even and nonlabored. Will continue to monitor.

## 2016-12-22 NOTE — ED Notes (Signed)
Pt up to toilet in room to urinate.

## 2016-12-22 NOTE — Interval H&P Note (Signed)
History and Physical Interval Note:  12/22/2016 1:34 PM  Kim Riley  has presented today for surgery, with the diagnosis of nnn  The various methods of treatment have been discussed with the patient and family. After consideration of risks, benefits and other options for treatment, the patient has consented to  Procedure(s): CYSTOSCOPY WITH RETROGRADE PYELOGRAM, URETEROSCOPY AND STENT PLACEMENT (Right) CYSTOSCOPY WITH STENT PLACEMENT (Right) as a surgical intervention .  The patient's history has been reviewed, patient examined, no change in status, stable for surgery.  I have reviewed the patient's chart and labs.  Questions were answered to the patient's satisfaction.    RR[facility] CTAB  Hollice Espy

## 2016-12-22 NOTE — H&P (View-Only) (Signed)
Consult: Right hydronephrosis, right ureteral stone Requested by: Dr. Merlyn Lot    History of Present Illness: 56 year old white female who developed right flank pain and presented to the emergency department. She underwent a CT scan of the abdomen and pelvis which showed a 4 mm right lower pole stone and a 10 mm right mid to distal stone with hydronephrosis. Her white count was 18 and UA showed many bacteria. She continues to feel poorly despite fluids antibiotics and pain medicine in the emergency department. She was admitted for ureteral stent in observation.  She has a history of kidney stones remotely and says she had a ureteral stent for "6 months". 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 Lottie Rater at [facility] urology.  Past Medical History:  Diagnosis Date  . Cardiomyopathy ([place])   . Kidney stone    History reviewed. No pertinent surgical history.  Home Medications:  Prescriptions Prior to Admission  Medication Sig Dispense Refill Last Dose  . alendronate (FOSAMAX) 70 MG tablet Take 1 tablet by mouth once a week.  1   . carvedilol (COREG) 6.25 MG tablet Take 1 tablet by mouth 2 (two) times daily.  9   . losartan (COZAAR) 50 MG tablet Take 1 tablet by mouth daily.  0    Allergies:  Allergies  Allergen Reactions  . Vicodin [Hydrocodone-Acetaminophen]   . Sulfa Antibiotics Rash    Family History  Problem Relation Age of Onset  . HIV Mother   . Brain cancer Father    Social History:  reports that she has never smoked. She has never used smokeless tobacco. She reports that she does not drink alcohol. Her drug history is not on file.  ROS: A complete review of systems was performed.  All systems are negative except for pertinent findings as noted. ROS   Physical Exam:  Vital signs in last 24 hours: Temp:  [97.5 F (36.4 C)-98 F (36.7 C)] 98 F (36.7 C) (03/26 1251) Pulse Rate:  [59-88] 87  (03/26 1251) Resp:  [24-28] 24 (03/26 1118) BP: (112-168)/(45-101) 112/64 (03/26 1251) SpO2:  [97 %-100 %] 99 % (03/26 1251) Weight:  [72.6 kg (160 lb)] 72.6 kg (160 lb) (03/26 0718) General:  Alert and oriented, No acute distress, ill-appearing HEENT: Normocephalic, atraumatic Neck: No JVD or lymphadenopathy Cardiovascular: Regular rate and rhythm Lungs: Regular rate and effort Abdomen: Soft, nontender, nondistended, no abdominal masses Back: No CVA tenderness Extremities: No edema Neurologic: Grossly intact  Laboratory Data:  Results for orders placed or performed during the hospital encounter of 12/22/16 (from the past 24 hour(s))  CBC with Differential/Platelet     Status: Abnormal   Collection Time: 12/22/16  7:31 AM  Result Value Ref Range   WBC 18.1 (H) 3.6 - 11.0 K/uL   RBC 4.31 3.80 - 5.20 MIL/uL   Hemoglobin 14.0 12.0 - 16.0 g/dL   HCT 40.4 35.0 - 47.0 %   MCV 93.7 80.0 - 100.0 fL   MCH 32.4 26.0 - 34.0 pg   MCHC 34.5 32.0 - 36.0 g/dL   RDW 12.3 11.5 - 14.5 %   Platelets 261 150 - 440 K/uL   Neutrophils Relative % 82 %   Neutro Abs 14.8 (H) 1.4 - 6.5 K/uL   Lymphocytes Relative 14 %   Lymphs Abs 2.6 1.0 - 3.6 K/uL   Monocytes Relative 4 %   Monocytes Absolute 0.7 0.2 -  0.9 K/uL   Eosinophils Relative 0 %   Eosinophils Absolute 0.0 0 - 0.7 K/uL   Basophils Relative 0 %   Basophils Absolute 0.1 0 - 0.1 K/uL  Comprehensive metabolic panel     Status: Abnormal   Collection Time: 12/22/16  7:31 AM  Result Value Ref Range   Sodium 133 (L) 135 - 145 mmol/L   Potassium 3.5 3.5 - 5.1 mmol/L   Chloride 100 (L) 101 - 111 mmol/L   CO2 22 22 - 32 mmol/L   Glucose, Bld 150 (H) 65 - 99 mg/dL   BUN 19 6 - 20 mg/dL   Creatinine, Ser 0.71 0.44 - 1.00 mg/dL   Calcium 9.4 8.9 - 10.3 mg/dL   Total Protein 7.4 6.5 - 8.1 g/dL   Albumin 4.8 3.5 - 5.0 g/dL   AST 26 15 - 41 U/L   ALT 19 14 - 54 U/L   Alkaline Phosphatase 44 38 - 126 U/L   Total Bilirubin 0.8 0.3 - 1.2 mg/dL    GFR calc non Af Amer >60 >60 mL/min   GFR calc Af Amer >60 >60 mL/min   Anion gap 11 5 - 15  Urinalysis, Complete w Microscopic     Status: Abnormal   Collection Time: 12/22/16  7:56 AM  Result Value Ref Range   Color, Urine YELLOW (A) YELLOW   APPearance HAZY (A) CLEAR   Specific Gravity, Urine 1.038 (H) 1.005 - 1.030   pH 5.0 5.0 - 8.0   Glucose, UA NEGATIVE NEGATIVE mg/dL   Hgb urine dipstick MODERATE (A) NEGATIVE   Bilirubin Urine NEGATIVE NEGATIVE   Ketones, ur 5 (A) NEGATIVE mg/dL   Protein, ur 30 (A) NEGATIVE mg/dL   Nitrite NEGATIVE NEGATIVE   Leukocytes, UA SMALL (A) NEGATIVE   RBC / HPF 6-30 0 - 5 RBC/hpf   WBC, UA 6-30 0 - 5 WBC/hpf   Bacteria, UA MANY (A) NONE SEEN   Squamous Epithelial / LPF 0-5 (A) NONE SEEN   Mucous PRESENT    No results found for this or any previous visit (from the past 240 hour(s)). Creatinine:  Recent Labs  12/22/16 0731  CREATININE 0.71    Impression/Assessment/plan: Right ureteral stone-she continues to feel poorly and I discussed with the patient the nature, potential benefits, risks and alternatives to cystoscopy, right retrograde pyelogram and right ureteral stent placement, including side effects of the proposed treatment, the likelihood of the patient achieving the goals of the procedure, and any potential problems that might occur during the procedure or recuperation.  We discussed the rationale for a staged procedure with URS at a later date. Also discussed my colleague Dr. Erlene Quan will be doing the procedure. All questions answered. Patient elects to proceed.    Kim Riley 12/22/2016, 1:14 PM

## 2016-12-22 NOTE — Consult Note (Signed)
Consult: Right hydronephrosis, right ureteral stone Requested by: Dr. Merlyn Lot    History of Present Illness: 56 year old white female who developed right flank pain and presented to the emergency department. She underwent a CT scan of the abdomen and pelvis which showed a 4 mm right lower pole stone and a 10 mm right mid to distal stone with hydronephrosis. Her white count was 18 and UA showed many bacteria. She continues to feel poorly despite fluids antibiotics and pain medicine in the emergency department. She was admitted for ureteral stent in observation.  She has a history of kidney stones remotely and says she had a ureteral stent for "6 months". 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 Lottie Rater at [facility].  Past Medical History:  Diagnosis Date  . Cardiomyopathy ([place])   . Kidney stone    History reviewed. No pertinent surgical history.  Home Medications:  Prescriptions Prior to Admission  Medication Sig Dispense Refill Last Dose  . alendronate (FOSAMAX) 70 MG tablet Take 1 tablet by mouth once a week.  1   . carvedilol (COREG) 6.25 MG tablet Take 1 tablet by mouth 2 (two) times daily.  9   . losartan (COZAAR) 50 MG tablet Take 1 tablet by mouth daily.  0    Allergies:  Allergies  Allergen Reactions  . Vicodin [Hydrocodone-Acetaminophen]   . Sulfa Antibiotics Rash    Family History  Problem Relation Age of Onset  . HIV Mother   . Brain cancer Father    Social History:  reports that she has never smoked. She has never used smokeless tobacco. She reports that she does not drink alcohol. Her drug history is not on file.  ROS: A complete review of systems was performed.  All systems are negative except for pertinent findings as noted. ROS   Physical Exam:  Vital signs in last 24 hours: Temp:  [97.5 F (36.4 C)-98 F (36.7 C)] 98 F (36.7 C) (03/26 1251) Pulse Rate:  [59-88] 87  (03/26 1251) Resp:  [24-28] 24 (03/26 1118) BP: (112-168)/(45-101) 112/64 (03/26 1251) SpO2:  [97 %-100 %] 99 % (03/26 1251) Weight:  [72.6 kg (160 lb)] 72.6 kg (160 lb) (03/26 0718) General:  Alert and oriented, No acute distress, ill-appearing HEENT: Normocephalic, atraumatic Neck: No JVD or lymphadenopathy Cardiovascular: Regular rate and rhythm Lungs: Regular rate and effort Abdomen: Soft, nontender, nondistended, no abdominal masses Back: No CVA tenderness Extremities: No edema Neurologic: Grossly intact  Laboratory Data:  Results for orders placed or performed during the hospital encounter of 12/22/16 (from the past 24 hour(s))  CBC with Differential/Platelet     Status: Abnormal   Collection Time: 12/22/16  7:31 AM  Result Value Ref Range   WBC 18.1 (H) 3.6 - 11.0 K/uL   RBC 4.31 3.80 - 5.20 MIL/uL   Hemoglobin 14.0 12.0 - 16.0 g/dL   HCT 40.4 35.0 - 47.0 %   MCV 93.7 80.0 - 100.0 fL   MCH 32.4 26.0 - 34.0 pg   MCHC 34.5 32.0 - 36.0 g/dL   RDW 12.3 11.5 - 14.5 %   Platelets 261 150 - 440 K/uL   Neutrophils Relative % 82 %   Neutro Abs 14.8 (H) 1.4 - 6.5 K/uL   Lymphocytes Relative 14 %   Lymphs Abs 2.6 1.0 - 3.6 K/uL   Monocytes Relative 4 %   Monocytes Absolute 0.7 0.2 -  0.9 K/uL   Eosinophils Relative 0 %   Eosinophils Absolute 0.0 0 - 0.7 K/uL   Basophils Relative 0 %   Basophils Absolute 0.1 0 - 0.1 K/uL  Comprehensive metabolic panel     Status: Abnormal   Collection Time: 12/22/16  7:31 AM  Result Value Ref Range   Sodium 133 (L) 135 - 145 mmol/L   Potassium 3.5 3.5 - 5.1 mmol/L   Chloride 100 (L) 101 - 111 mmol/L   CO2 22 22 - 32 mmol/L   Glucose, Bld 150 (H) 65 - 99 mg/dL   BUN 19 6 - 20 mg/dL   Creatinine, Ser 0.71 0.44 - 1.00 mg/dL   Calcium 9.4 8.9 - 10.3 mg/dL   Total Protein 7.4 6.5 - 8.1 g/dL   Albumin 4.8 3.5 - 5.0 g/dL   AST 26 15 - 41 U/L   ALT 19 14 - 54 U/L   Alkaline Phosphatase 44 38 - 126 U/L   Total Bilirubin 0.8 0.3 - 1.2 mg/dL    GFR calc non Af Amer >60 >60 mL/min   GFR calc Af Amer >60 >60 mL/min   Anion gap 11 5 - 15  Urinalysis, Complete w Microscopic     Status: Abnormal   Collection Time: 12/22/16  7:56 AM  Result Value Ref Range   Color, Urine YELLOW (A) YELLOW   APPearance HAZY (A) CLEAR   Specific Gravity, Urine 1.038 (H) 1.005 - 1.030   pH 5.0 5.0 - 8.0   Glucose, UA NEGATIVE NEGATIVE mg/dL   Hgb urine dipstick MODERATE (A) NEGATIVE   Bilirubin Urine NEGATIVE NEGATIVE   Ketones, ur 5 (A) NEGATIVE mg/dL   Protein, ur 30 (A) NEGATIVE mg/dL   Nitrite NEGATIVE NEGATIVE   Leukocytes, UA SMALL (A) NEGATIVE   RBC / HPF 6-30 0 - 5 RBC/hpf   WBC, UA 6-30 0 - 5 WBC/hpf   Bacteria, UA MANY (A) NONE SEEN   Squamous Epithelial / LPF 0-5 (A) NONE SEEN   Mucous PRESENT    No results found for this or any previous visit (from the past 240 hour(s)). Creatinine:  Recent Labs  12/22/16 0731  CREATININE 0.71    Impression/Assessment/plan: Right ureteral stone-she continues to feel poorly and I discussed with the patient the nature, potential benefits, risks and alternatives to cystoscopy, right retrograde pyelogram and right ureteral stent placement, including side effects of the proposed treatment, the likelihood of the patient achieving the goals of the procedure, and any potential problems that might occur during the procedure or recuperation.  We discussed the rationale for a staged procedure with URS at a later date. Also discussed my colleague Dr. Erlene Quan will be doing the procedure. All questions answered. Patient elects to proceed.    Kim Riley 12/22/2016, 1:14 PM

## 2016-12-23 ENCOUNTER — Encounter: Payer: Self-pay | Admitting: Urology

## 2016-12-23 DIAGNOSIS — N201 Calculus of ureter: Secondary | ICD-10-CM

## 2016-12-23 DIAGNOSIS — R109 Unspecified abdominal pain: Secondary | ICD-10-CM | POA: Diagnosis not present

## 2016-12-23 DIAGNOSIS — N1330 Unspecified hydronephrosis: Secondary | ICD-10-CM

## 2016-12-23 LAB — BASIC METABOLIC PANEL
ANION GAP: 8 (ref 5–15)
BUN: 17 mg/dL (ref 6–20)
CALCIUM: 8.4 mg/dL — AB (ref 8.9–10.3)
CO2: 23 mmol/L (ref 22–32)
Chloride: 108 mmol/L (ref 101–111)
Creatinine, Ser: 1.05 mg/dL — ABNORMAL HIGH (ref 0.44–1.00)
GFR, EST NON AFRICAN AMERICAN: 58 mL/min — AB (ref >60)
GLUCOSE: 126 mg/dL — AB (ref 65–99)
POTASSIUM: 4 mmol/L (ref 3.5–5.1)
Sodium: 139 mmol/L (ref 135–145)

## 2016-12-23 LAB — CBC
HEMATOCRIT: 36.7 % (ref 35.0–47.0)
HEMOGLOBIN: 12.5 g/dL (ref 12.0–16.0)
MCH: 32.2 pg (ref 26.0–34.0)
MCHC: 34.1 g/dL (ref 32.0–36.0)
MCV: 94.4 fL (ref 80.0–100.0)
Platelets: 170 10*3/uL (ref 150–440)
RBC: 3.89 MIL/uL (ref 3.80–5.20)
RDW: 12.7 % (ref 11.5–14.5)
WBC: 42.8 10*3/uL — ABNORMAL HIGH (ref 3.6–11.0)

## 2016-12-23 LAB — GENTAMICIN LEVEL, TROUGH: Gentamicin Trough: <0.5 ug/mL — ABNORMAL LOW (ref 0.5–2.0)

## 2016-12-23 LAB — HIV ANTIBODY (ROUTINE TESTING W REFLEX): HIV Screen 4th Generation wRfx: NONREACTIVE

## 2016-12-23 MED ORDER — ALPRAZOLAM 0.25 MG PO TABS
0.2500 mg | ORAL_TABLET | Freq: Two times a day (BID) | ORAL | Status: DC | PRN
  Administered 2016-12-24 – 2016-12-25 (×3): 0.25 mg via ORAL
  Filled 2016-12-23 (×3): qty 1

## 2016-12-23 MED ORDER — KETOROLAC TROMETHAMINE 30 MG/ML IJ SOLN
30.0000 mg | Freq: Four times a day (QID) | INTRAMUSCULAR | Status: DC | PRN
  Administered 2016-12-24 (×3): 30 mg via INTRAVENOUS
  Filled 2016-12-23 (×4): qty 1

## 2016-12-23 MED ORDER — ENOXAPARIN SODIUM 40 MG/0.4ML ~~LOC~~ SOLN
40.0000 mg | SUBCUTANEOUS | Status: DC
  Administered 2016-12-23 – 2016-12-24 (×2): 40 mg via SUBCUTANEOUS
  Filled 2016-12-23 (×2): qty 0.4

## 2016-12-23 MED ORDER — ACETAMINOPHEN 325 MG PO TABS
650.0000 mg | ORAL_TABLET | Freq: Four times a day (QID) | ORAL | Status: DC | PRN
  Administered 2016-12-23 – 2016-12-25 (×3): 650 mg via ORAL
  Filled 2016-12-23 (×3): qty 2

## 2016-12-23 MED ORDER — MORPHINE SULFATE (PF) 4 MG/ML IV SOLN
4.0000 mg | INTRAVENOUS | Status: DC | PRN
  Administered 2016-12-24: 4 mg via INTRAVENOUS
  Filled 2016-12-23: qty 1

## 2016-12-23 NOTE — Progress Notes (Signed)
Kim Riley at [place] NAME: Cheray Pardi    MR#:  [id number]  DATE OF BIRTH:  1961/07/29  SUBJECTIVE:  CHIEF COMPLAINT:   Chief Complaint  Patient presents with  . Flank Pain   Right flank pain improved. No vomiting. Feels weak. No appetite. No dysuria or hematuria.  REVIEW OF SYSTEMS:    Review of Systems  Constitutional: Positive for malaise/fatigue. Negative for chills and fever.  HENT: Negative for sore throat.   Eyes: Negative for blurred vision, double vision and pain.  Respiratory: Negative for cough, hemoptysis, shortness of breath and wheezing.   Cardiovascular: Negative for chest pain, palpitations, orthopnea and leg swelling.  Gastrointestinal: Positive for abdominal pain. Negative for constipation, diarrhea, heartburn, nausea and vomiting.  Genitourinary: Negative for dysuria and hematuria.  Musculoskeletal: Negative for back pain and joint pain.  Skin: Negative for rash.  Neurological: Positive for weakness. Negative for sensory change, speech change, focal weakness and headaches.  Endo/Heme/Allergies: Does not bruise/bleed easily.  Psychiatric/Behavioral: Negative for depression. The patient is not nervous/anxious.     DRUG ALLERGIES:   Allergies  Allergen Reactions  . Vicodin [Hydrocodone-Acetaminophen]   . Sulfa Antibiotics Rash    VITALS:  Blood pressure 100/66, pulse 66, temperature 97.3 F (36.3 C), temperature source Oral, resp. rate 20, height 5\' 8"  (1.727 m), weight 72.6 kg (160 lb), SpO2 99 %.  PHYSICAL EXAMINATION:   Physical Exam  GENERAL:  56 y.o.-year-old patient lying in the bed with no acute distress.  EYES: Pupils equal, round, reactive to light and accommodation. No scleral icterus. Extraocular muscles intact.  HEENT: Head atraumatic, normocephalic. Oropharynx and nasopharynx clear.  NECK:  Supple, no jugular venous distention. No thyroid enlargement, no tenderness.  LUNGS: Normal breath  sounds bilaterally, no wheezing, rales, rhonchi. No use of accessory muscles of respiration.  CARDIOVASCULAR: S1, S2 normal. No murmurs, rubs, or gallops.  ABDOMEN: Soft, nontender, nondistended. Bowel sounds present. No organomegaly or mass.  EXTREMITIES: No cyanosis, clubbing or edema b/l.    NEUROLOGIC: Cranial nerves II through XII are intact. No focal Motor or sensory deficits b/l.   PSYCHIATRIC: The patient is alert and oriented x 3.  SKIN: No obvious rash, lesion, or ulcer.   LABORATORY PANEL:   CBC  Recent Labs Lab 12/23/16 0620  WBC 42.8*  HGB 12.5  HCT 36.7  PLT 170   ------------------------------------------------------------------------------------------------------------------ Chemistries   Recent Labs Lab 12/22/16 0731 12/23/16 0620  NA 133* 139  K 3.5 4.0  CL 100* 108  CO2 22 23  GLUCOSE 150* 126*  BUN 19 17  CREATININE 0.71 1.05*  CALCIUM 9.4 8.4*  AST 26  --   ALT 19  --   ALKPHOS 44  --   BILITOT 0.8  --    ------------------------------------------------------------------------------------------------------------------  Cardiac Enzymes No results for input(s): TROPONINI in the last 168 hours. ------------------------------------------------------------------------------------------------------------------  RADIOLOGY:  Ct Renal Stone Study  Result Date: 12/22/2016 CLINICAL DATA:  Right flank and right lower quadrant abdominal pain beginning at 3 a.m. Dysuria. EXAM: CT ABDOMEN AND PELVIS WITHOUT CONTRAST TECHNIQUE: Multidetector CT imaging of the abdomen and pelvis was performed following the standard protocol without IV contrast. COMPARISON:  MRI of the abdomen 05/23/2014. FINDINGS: Lower chest: The lung bases are clear without focal nodule, mass, or airspace disease. The heart size is normal. No significant pleural or pericardial effusion is present. Hepatobiliary: No focal liver abnormality is seen. No gallstones, gallbladder wall thickening, or  biliary dilatation. Pancreas:  Unremarkable. No pancreatic ductal dilatation or surrounding inflammatory changes. Spleen: Normal in size without focal abnormality. Adrenals/Urinary Tract: The adrenal glands are normal bilaterally. Severe right-sided hydronephrosis is present. Right kidney is hypodense. There is marked dilation of the ureter to the level of an obstructing 10 mm stone in the distal ureter, just below the pelvic inlet. There is some scarring at the right lower pole. At least 4 additional nonobstructing stones at the right lower pole are noted. The largest measures 4.5 mm. There are no stones on the right. Several mass lesions are evident. 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 An ill-defined exophytic nodule along the central sinus is stable measuring 1.8 x 1.5 cm. This has been previously evaluated by MRI. The left ureter is normal. The urinary bladder is unremarkable. Stomach/Bowel: A small hiatal hernia is present. Stomach and duodenum are otherwise unremarkable. Small bowel is unremarkable. The appendix is not discretely visualized and may be surgically absent. There are no inflammatory changes about the cecum. The ascending and transverse colon are within normal limits. The descending and sigmoid colon are normal as well. Vascular/Lymphatic: Atherosclerotic calcifications are present in the aorta and branch vessels without aneurysm. No significant adenopathy is present. Reproductive: Uterus and bilateral adnexa are unremarkable. Other: No abdominal wall hernia or abnormality. No abdominopelvic ascites. Musculoskeletal: Degenerative changes are noted at the SI joints and lower lumbar facets. No focal lytic or blastic lesions are present. IMPRESSION: 1. Severe right-sided hydroureteronephrosis with an obstructing 10 mm stone in the distal ureter. 2. Additional nonobstructing stones at the lower pole of the right kidney measure up to 4 point 5 mm. 3. Stable lesions in the left kidney. 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 4. 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 Electronically Signed   By: San Morelle M.D.   On: 12/22/2016 08:11     ASSESSMENT AND PLAN:   * Right hydronephrosis due to ureteral stone and UTI with sepsis POA Status post right ureteral stent. On IV ceftriaxone. Worsening WBC today. Check blood cultures. Waiting on urine cultures. IV fluids.  * Cardiomyopathy with ejection fraction 45-50%. No signs of fluid overload. Hold Coreg and losartan.  * DVT prophylaxis with Lovenox  All the records are reviewed and case discussed with Care Management/Social Workerr. Management plans discussed with the patient, family and they are in agreement.  CODE STATUS: FULL CODE  DVT Prophylaxis: SCDs  TOTAL TIME TAKING CARE OF THIS PATIENT: 35 minutes.   POSSIBLE D/C IN 1-2 DAYS, DEPENDING ON CLINICAL CONDITION.  Hillary Bow R M.D on 12/23/2016 at 12:17 PM  Between 7am to 6pm - Pager - [phone]  After 6pm go to [email or web address] - password [email or web address] [facility]  Office  [phone]  CC: Primary care physician; Vista Mink, FNP  Note: This dictation was prepared with Dragon dictation along with smaller phrase technology. Any transcriptional errors that result from this process are unintentional.

## 2016-12-23 NOTE — Progress Notes (Signed)
Pharmacy Antibiotic Note  Kim Riley is a 56 y.o. female admitted on 12/22/2016 with sepsis and UTI.  Pharmacy has been consulted for gentamicin dosing. Patient is also on ceftriaxone. Gentamicin ordered by MD with no consult and [place] nomogram level not ordered upon initial pharmacy verification. Currently to late to obtain nomogram level.   Plan: Continue with gentamicin 5 mg/kg. Trough ordered prior to next dose. If trough is < 1, would proceed with current dose and f/u culture/sensitivities.   Height: 5\' 8"  (172.7 cm) Weight: 160 lb (72.6 kg) IBW/kg (Calculated) : 63.9  Temp (24hrs), Avg:98.2 F (36.8 C), Min:97.3 F (36.3 C), Max:99.4 F (37.4 C)   Recent Labs Lab 12/22/16 0731 12/23/16 0620  WBC 18.1* 42.8*  CREATININE 0.71 1.05*    Estimated Creatinine Clearance: 60.4 mL/min (A) (by C-G formula based on SCr of 1.05 mg/dL (H)).    Allergies  Allergen Reactions  . Vicodin [Hydrocodone-Acetaminophen]   . Sulfa Antibiotics Rash    Antimicrobials this admission: ceftriaxone 3/26 >>  gentamicin 3/26 >>   Dose adjustments this admission:   Microbiology results: 3/27 BCx: pending 3/26 UCx: pending  Thank you for allowing pharmacy to be a part of this patient's care.  Napoleon Form 12/23/2016 3:57 PM

## 2016-12-23 NOTE — Progress Notes (Signed)
Urology Consult Follow Up  Subjective: Febrile to 1019 yesterday perioperatively. Blood cultures drawn in PACU for unclear reasons, they seem to have been canceled.  Foley removed this morning. Hemodynamically stable. Minimal discomfort today.  Anti-infectives: Anti-infectives    Start     Dose/Rate Route Frequency Ordered Stop   12/23/16 1000  cefTRIAXone (ROCEPHIN) 1 g in dextrose 5 % 50 mL IVPB  Status:  Discontinued     1 g 120 mL/hr over 30 Minutes Intravenous Every 24 hours 12/22/16 1252 12/22/16 1300   12/23/16 1000  cefTRIAXone (ROCEPHIN) 1 g in dextrose 5 % 50 mL IVPB     1 g 120 mL/hr over 30 Minutes Intravenous Every 24 hours 12/22/16 1252     12/22/16 1600  gentamicin (GARAMYCIN) 360 mg in dextrose 5 % 100 mL IVPB     5 mg/kg  72.6 kg 109 mL/hr over 60 Minutes Intravenous Every 24 hours 12/22/16 1446     12/22/16 1345  ciprofloxacin (CIPRO) IVPB 400 mg     400 mg 200 mL/hr over 60 Minutes Intravenous  Once 12/22/16 1302     12/22/16 0845  cefTRIAXone (ROCEPHIN) IVPB 1 g     1 g 100 mL/hr over 30 Minutes Intravenous STAT 12/22/16 0832 12/22/16 0929   12/22/16 0830  cefTRIAXone (ROCEPHIN) 1 g in dextrose 5 % 50 mL IVPB  Status:  Discontinued     1 g 100 mL/hr over 30 Minutes Intravenous Every 24 hours 12/22/16 0818 12/22/16 1251      Current Facility-Administered Medications  Medication Dose Route Frequency Provider Last Rate Last Dose  . 0.9 %  sodium chloride infusion   Intravenous Continuous Demetrios Loll, MD 125 mL/hr at 12/23/16 0158    . albuterol (PROVENTIL) (2.5 MG/3ML) 0.083% nebulizer solution 2.5 mg  2.5 mg Nebulization Q2H PRN Demetrios Loll, MD      . ALPRAZolam Duanne Moron) tablet 0.25 mg  0.25 mg Oral BID PRN Hillary Bow, MD      . carvedilol (COREG) tablet 6.25 mg  6.25 mg Oral BID Demetrios Loll, MD   6.25 mg at 12/22/16 2104  . cefTRIAXone (ROCEPHIN) 1 g in dextrose 5 % 50 mL IVPB  1 g Intravenous Q24H Demetrios Loll, MD   1 g at 12/23/16 0951  . ciprofloxacin (CIPRO) IVPB  400 mg  400 mg Intravenous Once Festus Aloe, MD      . fentaNYL (SUBLIMAZE) injection 100 mcg  100 mcg Intravenous Q1H PRN Merlyn Lot, MD   100 mcg at 12/22/16 9937  . gentamicin (GARAMYCIN) 360 mg in dextrose 5 % 100 mL IVPB  5 mg/kg Intravenous Q24H Hollice Espy, MD   360 mg at 12/22/16 1630  . ketamine (KETALAR) injection 15 mg  0.2 mg/kg Intravenous Once Merlyn Lot, MD      . ketorolac (TORADOL) 30 MG/ML injection 30 mg  30 mg Intravenous Q6H PRN Demetrios Loll, MD   30 mg at 12/22/16 1629  . losartan (COZAAR) tablet 50 mg  50 mg Oral Daily Demetrios Loll, MD      . morphine 4 MG/ML injection 4 mg  4 mg Intravenous Q3H PRN Merlyn Lot, MD   4 mg at 12/23/16 1696  . ondansetron (ZOFRAN) tablet 4 mg  4 mg Oral Q6H PRN Demetrios Loll, MD       Or  . ondansetron [facility]) injection 4 mg  4 mg Intravenous Q6H PRN Demetrios Loll, MD      . oxyCODONE (Oxy IR/ROXICODONE) immediate release tablet  5 mg  5 mg Oral Q4H PRN Demetrios Loll, MD   5 mg at 12/22/16 2104  . senna-docusate (Senokot-S) tablet 1 tablet  1 tablet Oral QHS PRN Demetrios Loll, MD         Objective: Vital signs in last 24 hours: Temp:  [97.3 F (36.3 C)-101.9 F (38.8 C)] 97.3 F (36.3 C) (03/27 0546) Pulse Rate:  [66-104] 66 (03/27 0954) Resp:  [12-22] 20 (03/27 0546) BP: (94-142)/(61-88) 100/66 (03/27 0954) SpO2:  [99 %-100 %] 99 % (03/27 0546) Weight:  [160 lb (72.6 kg)] 160 lb (72.6 kg) (03/26 1338)  Intake/Output from previous day: 03/26 0701 - 03/27 0700 In: 3085.3 [P.O.:290; I.V.:2686.3; IV Piggyback:109] Out: 1700 [Urine:1700] Intake/Output this shift: Total I/O In: 222 [I.V.:222] Out: 450 [Urine:450]   Physical Exam  Constitutional: She is oriented to person, place, and time and well-developed, well-nourished, and in no distress.  HENT:  Head: Normocephalic and atraumatic.  Abdominal: Soft. She exhibits no distension. There is no tenderness.  Musculoskeletal: Normal range of motion.  Neurological: She is  alert and oriented to person, place, and time.  Skin: Skin is warm and dry.    Lab Results:   Recent Labs  12/22/16 0731 12/23/16 0620  WBC 18.1* 42.8*  HGB 14.0 12.5  HCT 40.4 36.7  PLT 261 170   BMET  Recent Labs  12/22/16 0731 12/23/16 0620  NA 133* 139  K 3.5 4.0  CL 100* 108  CO2 22 23  GLUCOSE 150* 126*  BUN 19 17  CREATININE 0.71 1.05*  CALCIUM 9.4 8.4*    Studies/Results: Ct Renal Stone Study  Result Date: 12/22/2016 CLINICAL DATA:  Right flank and right lower quadrant abdominal pain beginning at 3 a.m. Dysuria. EXAM: CT ABDOMEN AND PELVIS WITHOUT CONTRAST TECHNIQUE: Multidetector CT imaging of the abdomen and pelvis was performed following the standard protocol without IV contrast. COMPARISON:  MRI of the abdomen 05/23/2014. FINDINGS: Lower chest: The lung bases are clear without focal nodule, mass, or airspace disease. The heart size is normal. No significant pleural or pericardial effusion is present. Hepatobiliary: No focal liver abnormality is seen. No gallstones, gallbladder wall thickening, or biliary dilatation. Pancreas: Unremarkable. No pancreatic ductal dilatation or surrounding inflammatory changes. Spleen: Normal in size without focal abnormality. Adrenals/Urinary Tract: The adrenal glands are normal bilaterally. Severe right-sided hydronephrosis is present. Right kidney is hypodense. There is marked dilation of the ureter to the level of an obstructing 10 mm stone in the distal ureter, just below the pelvic inlet. There is some scarring at the right lower pole. At least 4 additional nonobstructing stones at the right lower pole are noted. The largest measures 4.5 mm. There are no stones on the right. Several mass lesions are evident. 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 An ill-defined exophytic nodule along the central sinus is stable measuring 1.8 x 1.5 cm. This has been previously evaluated by MRI. The left ureter is normal. The urinary bladder is unremarkable.  Stomach/Bowel: A small hiatal hernia is present. Stomach and duodenum are otherwise unremarkable. Small bowel is unremarkable. The appendix is not discretely visualized and may be surgically absent. There are no inflammatory changes about the cecum. The ascending and transverse colon are within normal limits. The descending and sigmoid colon are normal as well. Vascular/Lymphatic: Atherosclerotic calcifications are present in the aorta and branch vessels without aneurysm. No significant adenopathy is present. Reproductive: Uterus and bilateral adnexa are unremarkable. Other: No abdominal wall hernia or abnormality. No abdominopelvic ascites.  Musculoskeletal: Degenerative changes are noted at the SI joints and lower lumbar facets. No focal lytic or blastic lesions are present. IMPRESSION: 1. Severe right-sided hydroureteronephrosis with an obstructing 10 mm stone in the distal ureter. 2. Additional nonobstructing stones at the lower pole of the right kidney measure up to 4 point 5 mm. 3. Stable lesions in the left kidney. 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 4. 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 Electronically Signed   By: San Morelle M.D.   On: 12/22/2016 08:11     Assessment: s/p Procedure(s): POD1 s/p R ureteral stent placement.  Clinically improved today, WBC markedly elevated.  Cultures pending.  Voiding independently.  Currently on Ceftriaxone/ gentamycin.  Plan: -f/u cultures -continue broad spectrum abx until culture data returns -following up in ? cancelled blood cultures yesterday -ditropan/ flomax for stent pain as needed -will arrange for definitely management of the stone with outpatient ureteroscopy in about 2 weeks -Risks and benefits of ureteroscopy were reviewed including but not limited to infection, bleeding, pain, ureteral injury which could require open surgery versus prolonged indwelling if  ureteral perforation occurs, persistent stone disease, requirement for staged procedure, possible stent, and global anesthesia risks. Patient expressed understanding and desires to proceed with ureteroscopy.    LOS: 0 days    Hollice Espy 12/23/2016

## 2016-12-23 NOTE — Progress Notes (Signed)
Pharmacy Antibiotic Note  Kim Riley is a 56 y.o. female admitted on 12/22/2016 with sepsis and UTI.  Pharmacy has been consulted for gentamicin dosing. Patient is also on ceftriaxone.   Gentamicin ordered by MD with no consult and [place] nomogram level not ordered upon initial pharmacy verification. Currently too late to obtain nomogram level.   Plan: Gentamicin trough < 0.5 mcg/mL. Will continue with current regimen of gentamicin 5 mg/kg IV q24h and follow cultures/sensitivities.   Height: 5\' 8"  (172.7 cm) Weight: 160 lb (72.6 kg) IBW/kg (Calculated) : 63.9  Temp (24hrs), Avg:97.8 F (36.6 C), Min:97.3 F (36.3 C), Max:98.2 F (36.8 C)   Recent Labs Lab 12/22/16 0731 12/23/16 0620 12/23/16 1514  WBC 18.1* 42.8*  --   CREATININE 0.71 1.05*  --   GENTTROUGH  --   --  <0.5*    Estimated Creatinine Clearance: 60.4 mL/min (A) (by C-G formula based on SCr of 1.05 mg/dL (H)).    Allergies  Allergen Reactions  . Vicodin [Hydrocodone-Acetaminophen]   . Sulfa Antibiotics Rash    Antimicrobials this admission: ceftriaxone 3/26 >>  gentamicin 3/26 >>   Dose adjustments this admission:   Microbiology results: 3/27 BCx: pending 3/26 UCx: pending  Thank you for allowing pharmacy to be a part of this patient's care.  Lenis Noon, PharmD 12/23/2016 5:05 PM

## 2016-12-23 NOTE — Progress Notes (Signed)
MD notified of pt.'s WBC increased to 42.8 from 18.1 on 12/22/2016. Also, pt expressed to RN that she was having an anxiety attack and usually takes something at home for her anxiety, RN gave pt morphine 4 mg for pt.'s severe pain and anxiety. When pt was reassessed after morphine administration pt.'s anxiety had decreased. MD placed orders for anxiety medication and blood cultures due to increase in WBC. Will continue to monitor pt.  Konstantine Gervasi [employer]

## 2016-12-23 NOTE — Anesthesia Postprocedure Evaluation (Signed)
Anesthesia Post Note  Patient: Kim Riley  Procedure(s) Performed: Procedure(s) (LRB): CYSTOSCOPY WITH RETROGRADE PYELOGRAM (Right) CYSTOSCOPY WITH STENT PLACEMENT (Right)  Patient location during evaluation: PACU Anesthesia Type: General Level of consciousness: awake and alert Pain management: pain level controlled Vital Signs Assessment: post-procedure vital signs reviewed and stable Respiratory status: spontaneous breathing, nonlabored ventilation, respiratory function stable and patient connected to nasal cannula oxygen Cardiovascular status: blood pressure returned to baseline and stable Postop Assessment: no signs of nausea or vomiting Anesthetic complications: no     Last Vitals:  Vitals:   12/23/16 0546 12/23/16 0954  BP: 105/68 100/66  Pulse: 80 66  Resp: 20   Temp: 36.3 C     Last Pain:  Vitals:   12/23/16 0747  TempSrc:   PainSc: 4                  Molli Barrows

## 2016-12-23 NOTE — Progress Notes (Signed)
Verbal order from MD to D/C foley at this time. Pt is due to void by 1645 12/23/2016. Will continue to monitor.   Jared Cahn [employer]

## 2016-12-24 ENCOUNTER — Telehealth: Payer: Self-pay

## 2016-12-24 DIAGNOSIS — N1330 Unspecified hydronephrosis: Secondary | ICD-10-CM | POA: Diagnosis not present

## 2016-12-24 DIAGNOSIS — N201 Calculus of ureter: Secondary | ICD-10-CM | POA: Diagnosis not present

## 2016-12-24 DIAGNOSIS — R109 Unspecified abdominal pain: Secondary | ICD-10-CM | POA: Diagnosis not present

## 2016-12-24 LAB — BASIC METABOLIC PANEL
ANION GAP: 5 (ref 5–15)
BUN: 24 mg/dL — ABNORMAL HIGH (ref 6–20)
CHLORIDE: 109 mmol/L (ref 101–111)
CO2: 24 mmol/L (ref 22–32)
Calcium: 8.6 mg/dL — ABNORMAL LOW (ref 8.9–10.3)
Creatinine, Ser: 0.96 mg/dL (ref 0.44–1.00)
GFR calc non Af Amer: >60 mL/min (ref >60)
Glucose, Bld: 103 mg/dL — ABNORMAL HIGH (ref 65–99)
Potassium: 4 mmol/L (ref 3.5–5.1)
SODIUM: 138 mmol/L (ref 135–145)

## 2016-12-24 LAB — CBC WITH DIFFERENTIAL/PLATELET
BASOS ABS: 0 10*3/uL (ref 0–0.1)
BASOS PCT: 0 %
EOS ABS: 0 10*3/uL (ref 0–0.7)
Eosinophils Relative: 0 %
HEMATOCRIT: 34.4 % — AB (ref 35.0–47.0)
HEMOGLOBIN: 11.7 g/dL — AB (ref 12.0–16.0)
Lymphocytes Relative: 4 %
Lymphs Abs: 1.3 10*3/uL (ref 1.0–3.6)
MCH: 32 pg (ref 26.0–34.0)
MCHC: 34.1 g/dL (ref 32.0–36.0)
MCV: 93.7 fL (ref 80.0–100.0)
Monocytes Absolute: 1.8 10*3/uL — ABNORMAL HIGH (ref 0.2–0.9)
Monocytes Relative: 5 %
NEUTROS ABS: 33.8 10*3/uL — AB (ref 1.4–6.5)
NEUTROS PCT: 91 %
Platelets: 158 10*3/uL (ref 150–440)
RBC: 3.68 MIL/uL — ABNORMAL LOW (ref 3.80–5.20)
RDW: 12.7 % (ref 11.5–14.5)
WBC: 37 10*3/uL — ABNORMAL HIGH (ref 3.6–11.0)

## 2016-12-24 MED ORDER — TAMSULOSIN HCL 0.4 MG PO CAPS
0.4000 mg | ORAL_CAPSULE | Freq: Every day | ORAL | Status: DC
Start: 2016-12-24 — End: 2016-12-25
  Administered 2016-12-24 – 2016-12-25 (×2): 0.4 mg via ORAL
  Filled 2016-12-24 (×2): qty 1

## 2016-12-24 MED ORDER — OXYBUTYNIN CHLORIDE 5 MG PO TABS
5.0000 mg | ORAL_TABLET | Freq: Three times a day (TID) | ORAL | Status: DC | PRN
  Filled 2016-12-24: qty 1

## 2016-12-24 MED ORDER — HYDROMORPHONE HCL 1 MG/ML IJ SOLN
1.0000 mg | INTRAMUSCULAR | Status: AC
  Administered 2016-12-24: 1 mg via INTRAVENOUS
  Filled 2016-12-24: qty 1

## 2016-12-24 NOTE — Progress Notes (Signed)
Urology Consult Follow Up  Subjective: Afebrile over the past 24 hours. White count is improving. Complaints of blood in the urine which is expected. No other complaints.  Anti-infectives: Anti-infectives    Start     Dose/Rate Route Frequency Ordered Stop   12/23/16 1000  cefTRIAXone (ROCEPHIN) 1 g in dextrose 5 % 50 mL IVPB  Status:  Discontinued     1 g 120 mL/hr over 30 Minutes Intravenous Every 24 hours 12/22/16 1252 12/22/16 1300   12/23/16 1000  cefTRIAXone (ROCEPHIN) 1 g in dextrose 5 % 50 mL IVPB     1 g 120 mL/hr over 30 Minutes Intravenous Every 24 hours 12/22/16 1252     12/22/16 1600  gentamicin (GARAMYCIN) 360 mg in dextrose 5 % 100 mL IVPB     5 mg/kg  72.6 kg 109 mL/hr over 60 Minutes Intravenous Every 24 hours 12/22/16 1446     12/22/16 1345  ciprofloxacin (CIPRO) IVPB 400 mg     400 mg 200 mL/hr over 60 Minutes Intravenous  Once 12/22/16 1302     12/22/16 0845  cefTRIAXone (ROCEPHIN) IVPB 1 g     1 g 100 mL/hr over 30 Minutes Intravenous STAT 12/22/16 0832 12/22/16 0929   12/22/16 0830  cefTRIAXone (ROCEPHIN) 1 g in dextrose 5 % 50 mL IVPB  Status:  Discontinued     1 g 100 mL/hr over 30 Minutes Intravenous Every 24 hours 12/22/16 0818 12/22/16 1251      Current Facility-Administered Medications  Medication Dose Route Frequency Provider Last Rate Last Dose  . 0.9 %  sodium chloride infusion   Intravenous Continuous Hillary Bow, MD 75 mL/hr at 12/23/16 1243    . acetaminophen (TYLENOL) tablet 650 mg  650 mg Oral Q6H PRN Hillary Bow, MD   650 mg at 12/23/16 1243  . albuterol (PROVENTIL) (2.5 MG/3ML) 0.083% nebulizer solution 2.5 mg  2.5 mg Nebulization Q2H PRN Demetrios Loll, MD      . ALPRAZolam Duanne Moron) tablet 0.25 mg  0.25 mg Oral BID PRN Hillary Bow, MD   0.25 mg at 12/24/16 0438  . cefTRIAXone (ROCEPHIN) 1 g in dextrose 5 % 50 mL IVPB  1 g Intravenous Q24H Demetrios Loll, MD   1 g at 12/24/16 [phone]  . ciprofloxacin (CIPRO) IVPB 400 mg  400 mg Intravenous Once  Festus Aloe, MD      . enoxaparin (LOVENOX) injection 40 mg  40 mg Subcutaneous Q24H Hillary Bow, MD   40 mg at 12/23/16 1741  . gentamicin (GARAMYCIN) 360 mg in dextrose 5 % 100 mL IVPB  5 mg/kg Intravenous Q24H Hollice Espy, MD   360 mg at 12/23/16 1533  . ketamine (KETALAR) injection 15 mg  0.2 mg/kg Intravenous Once Merlyn Lot, MD      . ketorolac (TORADOL) 30 MG/ML injection 30 mg  30 mg Intravenous Q6H PRN Hillary Bow, MD   30 mg at 12/24/16 0123  . morphine 4 MG/ML injection 4 mg  4 mg Intravenous Q3H PRN Srikar Sudini, MD      . ondansetron (ZOFRAN) tablet 4 mg  4 mg Oral Q6H PRN Demetrios Loll, MD       Or  . ondansetron [facility]) injection 4 mg  4 mg Intravenous Q6H PRN Demetrios Loll, MD   4 mg at 12/23/16 1820  . oxyCODONE (Oxy IR/ROXICODONE) immediate release tablet 5 mg  5 mg Oral Q4H PRN Demetrios Loll, MD   5 mg at 12/24/16 0841  . senna-docusate (Senokot-S) tablet  1 tablet  1 tablet Oral QHS PRN Demetrios Loll, MD         Objective: Vital signs in last 24 hours: Temp:  [97.9 F (36.6 C)-98.4 F (36.9 C)] 98.4 F (36.9 C) (03/28 0402) Pulse Rate:  [66-77] 68 (03/28 0402) Resp:  [18-20] 20 (03/28 0402) BP: (101-128)/(69-86) 128/86 (03/28 0402) SpO2:  [97 %-99 %] 97 % (03/28 0402)  Intake/Output from previous day: 03/27 0701 - 03/28 0700 In: 2012 [P.O.:120; I.V.:1790; IV Piggyback:102] Out: 1700 [Urine:1700] Intake/Output this shift: Total I/O In: 228 [IV Piggyback:228] Out: 100 [Urine:100]   Physical Exam  Constitutional: She is oriented to person, place, and time and well-developed, well-nourished, and in no distress.  HENT:  Head: Normocephalic and atraumatic.  Abdominal: Soft. She exhibits no distension. There is no tenderness.  Musculoskeletal: Normal range of motion.  Neurological: She is alert and oriented to person, place, and time.  Skin: Skin is warm and dry.    Lab Results:   Recent Labs  12/23/16 0620 12/24/16 0452  WBC 42.8* 37.0*  HGB 12.5  11.7*  HCT 36.7 34.4*  PLT 170 158   BMET  Recent Labs  12/23/16 0620 12/24/16 0452  NA 139 138  K 4.0 4.0  CL 108 109  CO2 23 24  GLUCOSE 126* 103*  BUN 17 24*  CREATININE 1.05* 0.96  CALCIUM 8.4* 8.6*    Studies/Results: No results found.   Assessment: s/p Procedure(s): POD2 s/p R ureteral stent placement 2/2 septic obstructing stone  Clinically improved, now afebrile with downward trending white count.   Urine culture growing gram-negative rods, awaiting speciation and sensitivities  Plan: -Okay to discharge home from urology perspective with by mouth antibiotics, can adjust as needed based on culture and sensitivity data as outpatient -flomax and ditropan for stent pain prn -reviewed blood in urine as normal side effect from stent -Ureteroscopy scheduled in ~2 weeks for definitive management of her stone   LOS: 0 days    Hollice Espy 12/24/2016

## 2016-12-24 NOTE — Progress Notes (Signed)
[facility] NAME: Kim Riley    MR#:  [id number]  DATE OF BIRTH:  Jun 21, 1961  SUBJECTIVE:  CHIEF COMPLAINT:   Chief Complaint  Patient presents with  . Flank Pain   -Admitted with right-sided hydronephrosis secondary to a distal stone in the ureter. Status post stent placement. -Complaint of sharp right flank pain after straining today. Very anxious. Afebrile in the last 24 hours.  REVIEW OF SYSTEMS:  Review of Systems  Constitutional: Negative for chills, fever and malaise/fatigue.  HENT: Negative for congestion, ear discharge, hearing loss and nosebleeds.   Eyes: Negative for blurred vision and double vision.  Respiratory: Negative for cough, shortness of breath and wheezing.   Cardiovascular: Negative for chest pain, palpitations and leg swelling.  Gastrointestinal: Positive for abdominal pain. Negative for constipation, diarrhea, nausea and vomiting.  Genitourinary: Positive for hematuria. Negative for dysuria.  Musculoskeletal: Positive for back pain. Negative for myalgias.  Neurological: Negative for dizziness, sensory change, speech change, focal weakness, seizures and headaches.  Psychiatric/Behavioral: Negative for depression.    DRUG ALLERGIES:   Allergies  Allergen Reactions  . Vicodin [Hydrocodone-Acetaminophen]   . Sulfa Antibiotics Rash    VITALS:  Blood pressure 128/86, pulse 68, temperature 98.4 F (36.9 C), temperature source Oral, resp. rate 20, height 5\' 8"  (1.727 m), weight 72.6 kg (160 lb), SpO2 97 %.  PHYSICAL EXAMINATION:  Physical Exam  GENERAL:  56 y.o.-year-old patient lying in the bed with no acute distress.  EYES: Pupils equal, round, reactive to light and accommodation. No scleral icterus. Extraocular muscles intact.  HEENT: Head atraumatic, normocephalic. Oropharynx and nasopharynx clear.  NECK:  Supple, no jugular venous distention. No thyroid enlargement, no tenderness.  LUNGS:  Normal breath sounds bilaterally, no wheezing, rales,rhonchi or crepitation. No use of accessory muscles of respiration.  CARDIOVASCULAR: S1, S2 normal. No murmurs, rubs, or gallops.  ABDOMEN: Soft, nontender,Right flank discomfort on palpation, nondistended. Bowel sounds present. No organomegaly or mass.  EXTREMITIES: No pedal edema, cyanosis, or clubbing.  NEUROLOGIC: Cranial nerves II through XII are intact. Muscle strength 5/5 in all extremities. Sensation intact. Gait not checked.  PSYCHIATRIC: The patient is alert and oriented x 3. Anxious SKIN: No obvious rash, lesion, or ulcer.    LABORATORY PANEL:   CBC  Recent Labs Lab 12/24/16 0452  WBC 37.0*  HGB 11.7*  HCT 34.4*  PLT 158   ------------------------------------------------------------------------------------------------------------------  Chemistries   Recent Labs Lab 12/22/16 0731  12/24/16 0452  NA 133*  < > 138  K 3.5  < > 4.0  CL 100*  < > 109  CO2 22  < > 24  GLUCOSE 150*  < > 103*  BUN 19  < > 24*  CREATININE 0.71  < > 0.96  CALCIUM 9.4  < > 8.6*  AST 26  --   --   ALT 19  --   --   ALKPHOS 44  --   --   BILITOT 0.8  --   --   < > = values in this interval not displayed. ------------------------------------------------------------------------------------------------------------------  Cardiac Enzymes No results for input(s): TROPONINI in the last 168 hours. ------------------------------------------------------------------------------------------------------------------  RADIOLOGY:  No results found.  EKG:  No orders found for this or any previous visit.  ASSESSMENT AND PLAN:   56 year old female with past medical history significant for anxiety, cardiomyopathy, history of kidney stones presents to hospital secondary to right flank pain and right-sided hydronephrosis.  #1 right  hydronephrosis secondary to right ureteral stone-appreciate urology input. -Patient had fevers and elevated white  count. Blood cultures are pending at this time. Urine cultures growing Escherichia coli. -On Rocephin and gentamicin was added for possible severe sepsis due to elevated white count. Continue to watch for blood cultures. -Afebrile in the last 24 hours. WBC is slowly trending down. -Status post right ureteral stent placement. And outpatient plan for urethroscopy in 2 weeks for management of first on. -Continue Flomax and at the triptan as needed for stent pain.  #2 anxiety-continue Xanax when necessary  #3 cardiomyopathy- EF is 45-50%, hold coreg and losartan due to low BP  #4 DVT prophylaxis-on Lovenox    Discussed with family at bedside. Also discussed with Dr. Hollice Espy   All the records are reviewed and case discussed with Care Management/Social Workerr. Management plans discussed with the patient, family and they are in agreement.  CODE STATUS:  Full code  TOTAL TIME TAKING CARE OF THIS PATIENT: 38 minutes.   POSSIBLE D/C IN 1-2 DAYS, DEPENDING ON CLINICAL CONDITION.   Gladstone Lighter M.D on 12/24/2016 at 2:25 PM  Between 7am to 6pm - Pager - [phone]  After 6pm go to [email or web address] - password [email or web address] [facility]  Office  [phone]  CC: Primary care physician; Vista Mink, FNP

## 2016-12-24 NOTE — Telephone Encounter (Signed)
I spoke w/ the pt concerning her surgery. The pt was notified of the date of Surgery and Pre-admit phone appt. She verbalize understanding, no questions or concerns.

## 2016-12-24 NOTE — Progress Notes (Signed)
When RN arrived to floor pt crying and screaming out in pain that was on her right side and radiating to her back. RN gave pt PRN morphine at 1310 4 mg. Upon reassessment pt was still crying and screaming out in pain, RN notified prime doctor, new orders for one time dose of dilaudid 1 mg. Will continue to monitor pt.  Jye Fariss [employer]

## 2016-12-24 NOTE — Progress Notes (Signed)
Spoke with Dr. Marcille Blanco in regards to pts urine becoming bloodier. Acknowledged. No new orders at this time.

## 2016-12-25 ENCOUNTER — Telehealth: Payer: Self-pay | Admitting: Urology

## 2016-12-25 DIAGNOSIS — Z87442 Personal history of urinary calculi: Secondary | ICD-10-CM | POA: Diagnosis not present

## 2016-12-25 DIAGNOSIS — N136 Pyonephrosis: Secondary | ICD-10-CM | POA: Diagnosis present

## 2016-12-25 DIAGNOSIS — Z808 Family history of malignant neoplasm of other organs or systems: Secondary | ICD-10-CM | POA: Diagnosis not present

## 2016-12-25 DIAGNOSIS — Z885 Allergy status to narcotic agent status: Secondary | ICD-10-CM | POA: Diagnosis not present

## 2016-12-25 DIAGNOSIS — F419 Anxiety disorder, unspecified: Secondary | ICD-10-CM | POA: Diagnosis present

## 2016-12-25 DIAGNOSIS — N390 Urinary tract infection, site not specified: Secondary | ICD-10-CM | POA: Diagnosis present

## 2016-12-25 DIAGNOSIS — R011 Cardiac murmur, unspecified: Secondary | ICD-10-CM | POA: Diagnosis present

## 2016-12-25 DIAGNOSIS — I471 Supraventricular tachycardia: Secondary | ICD-10-CM | POA: Diagnosis present

## 2016-12-25 DIAGNOSIS — Z882 Allergy status to sulfonamides status: Secondary | ICD-10-CM | POA: Diagnosis not present

## 2016-12-25 DIAGNOSIS — Z7983 Long term (current) use of bisphosphonates: Secondary | ICD-10-CM | POA: Diagnosis not present

## 2016-12-25 DIAGNOSIS — I429 Cardiomyopathy, unspecified: Secondary | ICD-10-CM | POA: Diagnosis present

## 2016-12-25 DIAGNOSIS — I10 Essential (primary) hypertension: Secondary | ICD-10-CM | POA: Diagnosis present

## 2016-12-25 DIAGNOSIS — E871 Hypo-osmolality and hyponatremia: Secondary | ICD-10-CM | POA: Diagnosis present

## 2016-12-25 DIAGNOSIS — A419 Sepsis, unspecified organism: Secondary | ICD-10-CM | POA: Diagnosis present

## 2016-12-25 DIAGNOSIS — Z830 Family history of human immunodeficiency virus [HIV] disease: Secondary | ICD-10-CM | POA: Diagnosis not present

## 2016-12-25 DIAGNOSIS — B9620 Unspecified Escherichia coli [E. coli] as the cause of diseases classified elsewhere: Secondary | ICD-10-CM | POA: Diagnosis present

## 2016-12-25 DIAGNOSIS — R109 Unspecified abdominal pain: Secondary | ICD-10-CM | POA: Diagnosis present

## 2016-12-25 DIAGNOSIS — Z79899 Other long term (current) drug therapy: Secondary | ICD-10-CM | POA: Diagnosis not present

## 2016-12-25 DIAGNOSIS — D3002 Benign neoplasm of left kidney: Secondary | ICD-10-CM | POA: Diagnosis present

## 2016-12-25 LAB — URINE CULTURE: Culture: >=100000 — AB

## 2016-12-25 LAB — BASIC METABOLIC PANEL
Anion gap: 6 (ref 5–15)
BUN: 32 mg/dL — AB (ref 6–20)
CALCIUM: 8.3 mg/dL — AB (ref 8.9–10.3)
CHLORIDE: 108 mmol/L (ref 101–111)
CO2: 24 mmol/L (ref 22–32)
CREATININE: 0.87 mg/dL (ref 0.44–1.00)
GFR calc Af Amer: >60 mL/min (ref >60)
GFR calc non Af Amer: >60 mL/min (ref >60)
GLUCOSE: 82 mg/dL (ref 65–99)
Potassium: 3.9 mmol/L (ref 3.5–5.1)
Sodium: 138 mmol/L (ref 135–145)

## 2016-12-25 LAB — CBC
HEMATOCRIT: 33.9 % — AB (ref 35.0–47.0)
HEMOGLOBIN: 12 g/dL (ref 12.0–16.0)
MCH: 33.4 pg (ref 26.0–34.0)
MCHC: 35.5 g/dL (ref 32.0–36.0)
MCV: 93.9 fL (ref 80.0–100.0)
Platelets: 156 10*3/uL (ref 150–440)
RBC: 3.61 MIL/uL — ABNORMAL LOW (ref 3.80–5.20)
RDW: 12.6 % (ref 11.5–14.5)
WBC: 25.6 10*3/uL — ABNORMAL HIGH (ref 3.6–11.0)

## 2016-12-25 MED ORDER — SENNOSIDES-DOCUSATE SODIUM 8.6-50 MG PO TABS: 1.0000 | ORAL_TABLET | Freq: Every day | ORAL | 0 refills | Status: DC

## 2016-12-25 MED ORDER — OXYCODONE-ACETAMINOPHEN 7.5-325 MG PO TABS: 1.0000 | ORAL_TABLET | Freq: Four times a day (QID) | ORAL | 0 refills | Status: DC | PRN

## 2016-12-25 MED ORDER — POLYETHYLENE GLYCOL 3350 17 G PO PACK: 17.0000 g | PACK | Freq: Every day | ORAL | 0 refills | Status: DC | PRN

## 2016-12-25 MED ORDER — ONDANSETRON HCL 4 MG PO TABS: 4.0000 mg | ORAL_TABLET | Freq: Four times a day (QID) | ORAL | 0 refills | Status: DC | PRN

## 2016-12-25 MED ORDER — CEFDINIR 300 MG PO CAPS: 300.0000 mg | ORAL_CAPSULE | Freq: Two times a day (BID) | ORAL | 0 refills | Status: DC

## 2016-12-25 MED ORDER — TAMSULOSIN HCL 0.4 MG PO CAPS: 0.4000 mg | ORAL_CAPSULE | Freq: Every day | ORAL | 0 refills | Status: DC

## 2016-12-25 NOTE — Progress Notes (Signed)
Patient discharge teaching given, including activity, diet, follow-up appoints, and medications. Patient verbalized understanding of all discharge instructions. IV access was d/c'd. Vitals are stable. Skin is intact except as charted in most recent assessments. Pt to be escorted out by NT, to be driven home by family.  Kim Riley  

## 2016-12-25 NOTE — Discharge Summary (Signed)
[facility] [place] NAME: Kim Riley    MR#:  [id number]  DATE OF BIRTH:  [birth year]  DATE OF ADMISSION:  12/22/2016   ADMITTING PHYSICIAN: Demetrios Loll, MD  DATE OF DISCHARGE: 12/25/2016  PRIMARY CARE PHYSICIAN: Vista Mink, FNP   ADMISSION DIAGNOSIS:   Ureterolithiasis [N20.1] Right flank pain [R10.9] Hydronephrosis, unspecified hydronephrosis type [N13.30]  DISCHARGE DIAGNOSIS:   Active Problems:   Hydronephrosis   Ureterolithiasis   Right flank pain   Sepsis ([place])   SECONDARY DIAGNOSIS:   Past Medical History:  Diagnosis Date  . Cardiomyopathy ([place])   . Kidney stone     HOSPITAL COURSE:   56 year old female with past medical history significant for anxiety, cardiomyopathy, history of kidney stones presents to hospital secondary to right flank pain and right-sided hydronephrosis.  #1 sepsis- secondary to right pyelonephritis - blood cultures unfortunately were not drawn on admission. Urine cultures growing Escherichia coli. -WBC has been significantly elevated. Improving at this time with Rocephin and gentamicin. Since cultures are negative at this point, will be discharged on oral antibiotics.  #2 right hydronephrosis secondary to right ureteral stone-appreciate urology input. -On Rocephin and gentamicin was added for possible severe sepsis due to elevated white count.  -Status post right ureteral stent placement. And outpatient plan for urethroscopy in 10 days. -Continue Flomax. -Occasional right flank pain and hematuria as expected from the stent according to urology. Advised to call if pain is not improving with oral pain medications or if spiking fevers.  #3 anxiety-continue Xanax when necessary-outpatient medication, to be refilled by her physician  #4 cardiomyopathy- EF is 45-50%, restarted coreg and losartan at discharge as BP has improved  Stable for discharge today according to  urology  DISCHARGE CONDITIONS:   Stable  CONSULTS OBTAINED:   Treatment Team:  Festus Aloe, MD  DRUG ALLERGIES:   Allergies  Allergen Reactions  . Sulfa Antibiotics Rash    Could've been caused by sun exposure.  . Vicodin [Hydrocodone-Acetaminophen] Rash   DISCHARGE MEDICATIONS:   Allergies as of 12/25/2016      Reactions   Sulfa Antibiotics Rash   Could've been caused by sun exposure.   Vicodin [hydrocodone-acetaminophen] Rash      Medication List    TAKE these medications   alendronate 70 MG tablet Commonly known as:  FOSAMAX Take 1 tablet by mouth once a week.   carvedilol 6.25 MG tablet Commonly known as:  COREG Take 1 tablet by mouth 2 (two) times daily.   cefdinir 300 MG capsule Commonly known as:  OMNICEF Take 1 capsule (300 mg total) by mouth 2 (two) times daily. X 7 more days   losartan 50 MG tablet Commonly known as:  COZAAR Take 1 tablet by mouth daily.   ondansetron 4 MG tablet Commonly known as:  ZOFRAN Take 1 tablet (4 mg total) by mouth every 6 (six) hours as needed for nausea.   oxyCODONE-acetaminophen 7.5-325 MG tablet Commonly known as:  PERCOCET Take 1 tablet by mouth every 6 (six) hours as needed for moderate pain or severe pain.   polyethylene glycol packet Commonly known as:  MIRALAX / GLYCOLAX Take 17 g by mouth daily as needed for mild constipation.   senna-docusate 8.6-50 MG tablet Commonly known as:  Senokot-S Take 1 tablet by mouth daily.   tamsulosin 0.4 MG Caps capsule Commonly known as:  FLOMAX Take 1 capsule (0.4 mg total) by mouth daily. Start taking on:  12/26/2016  DISCHARGE INSTRUCTIONS:   1. PCP follow-up in 1 week 2. Urology follow-up in 10 days  DIET:   Cardiac diet  ACTIVITY:   Activity as tolerated  OXYGEN:   Home Oxygen: No.  Oxygen Delivery: room air  DISCHARGE LOCATION:   home   If you experience worsening of your admission symptoms, develop shortness of breath, life  threatening emergency, suicidal or homicidal thoughts you must seek medical attention immediately by calling 911 or calling your MD immediately  if symptoms less severe.  You Must read complete instructions/literature along with all the possible adverse reactions/side effects for all the Medicines you take and that have been prescribed to you. Take any new Medicines after you have completely understood and accpet all the possible adverse reactions/side effects.   Please note  You were cared for by a hospitalist during your hospital stay. If you have any questions about your discharge medications or the care you received while you were in the hospital after you are discharged, you can call the unit and asked to speak with the hospitalist on call if the hospitalist that took care of you is not available. Once you are discharged, your primary care physician will handle any further medical issues. Please note that NO REFILLS for any discharge medications will be authorized once you are discharged, as it is imperative that you return to your primary care physician (or establish a relationship with a primary care physician if you do not have one) for your aftercare needs so that they can reassess your need for medications and monitor your lab values.    On the day of Discharge:  VITAL SIGNS:   Blood pressure 134/78, pulse 66, temperature 98.1 F (36.7 C), temperature source Oral, resp. rate 20, height 5\' 8"  (1.727 m), weight 72.6 kg (160 lb), SpO2 99 %.  PHYSICAL EXAMINATION:    GENERAL:  56 y.o.-year-old patient lying in the bed with no acute distress.  EYES: Pupils equal, round, reactive to light and accommodation. No scleral icterus. Extraocular muscles intact.  HEENT: Head atraumatic, normocephalic. Oropharynx and nasopharynx clear.  NECK:  Supple, no jugular venous distention. No thyroid enlargement, no tenderness.  LUNGS: Normal breath sounds bilaterally, no wheezing, rales,rhonchi or  crepitation. No use of accessory muscles of respiration.  CARDIOVASCULAR: S1, S2 normal. No murmurs, rubs, or gallops.  ABDOMEN: Soft, nontender, minimal Right flank discomfort on palpation, nondistended. Bowel sounds present. No organomegaly or mass.  EXTREMITIES: No pedal edema, cyanosis, or clubbing.  NEUROLOGIC: Cranial nerves II through XII are intact. Muscle strength 5/5 in all extremities. Sensation intact. Gait not checked.  PSYCHIATRIC: The patient is alert and oriented x 3.  SKIN: No obvious rash, lesion, or ulcer.   DATA REVIEW:   CBC  Recent Labs Lab 12/25/16 0412  WBC 25.6*  HGB 12.0  HCT 33.9*  PLT 156    Chemistries   Recent Labs Lab 12/22/16 0731  12/25/16 0412  NA 133*  < > 138  K 3.5  < > 3.9  CL 100*  < > 108  CO2 22  < > 24  GLUCOSE 150*  < > 82  BUN 19  < > 32*  CREATININE 0.71  < > 0.87  CALCIUM 9.4  < > 8.3*  AST 26  --   --   ALT 19  --   --   ALKPHOS 44  --   --   BILITOT 0.8  --   --   < > = values  in this interval not displayed.   Microbiology Results  Results for orders placed or performed during the hospital encounter of 12/22/16  Urine culture     Status: Abnormal   Collection Time: 12/22/16  7:56 AM  Result Value Ref Range Status   Specimen Description URINE, CLEAN CATCH  Final   Special Requests NONE  Final   Culture >=100,000 COLONIES/mL ESCHERICHIA COLI (A)  Final   Report Status 12/25/2016 FINAL  Final   Organism ID, Bacteria ESCHERICHIA COLI (A)  Final      Susceptibility   Escherichia coli - MIC*    AMPICILLIN 8 SENSITIVE Sensitive     CEFAZOLIN <=4 SENSITIVE Sensitive     CEFTRIAXONE <=1 SENSITIVE Sensitive     CIPROFLOXACIN <=0.25 SENSITIVE Sensitive     GENTAMICIN <=1 SENSITIVE Sensitive     IMIPENEM <=0.25 SENSITIVE Sensitive     NITROFURANTOIN <=16 SENSITIVE Sensitive     TRIMETH/SULFA <=20 SENSITIVE Sensitive     AMPICILLIN/SULBACTAM 4 SENSITIVE Sensitive     PIP/TAZO <=4 SENSITIVE Sensitive     Extended ESBL  NEGATIVE Sensitive     * >=100,000 COLONIES/mL ESCHERICHIA COLI  Urine culture     Status: Abnormal   Collection Time: 12/22/16  2:13 PM  Result Value Ref Range Status   Specimen Description CYSTOSCOPY URINE CYSTOSCOPE  Final   Special Requests NONE  Final   Culture 40,000 COLONIES/mL ESCHERICHIA COLI (A)  Final   Report Status 12/25/2016 FINAL  Final   Organism ID, Bacteria ESCHERICHIA COLI (A)  Final      Susceptibility   Escherichia coli - MIC*    AMPICILLIN 16 INTERMEDIATE Intermediate     CEFAZOLIN <=4 SENSITIVE Sensitive     CEFEPIME <=1 SENSITIVE Sensitive     CEFTAZIDIME <=1 SENSITIVE Sensitive     CEFTRIAXONE <=1 SENSITIVE Sensitive     CIPROFLOXACIN <=0.25 SENSITIVE Sensitive     GENTAMICIN <=1 SENSITIVE Sensitive     IMIPENEM <=0.25 SENSITIVE Sensitive     TRIMETH/SULFA <=20 SENSITIVE Sensitive     AMPICILLIN/SULBACTAM 4 SENSITIVE Sensitive     PIP/TAZO <=4 SENSITIVE Sensitive     Extended ESBL NEGATIVE Sensitive     * 40,000 COLONIES/mL ESCHERICHIA COLI  CULTURE, BLOOD (ROUTINE X 2) w Reflex to ID Panel     Status: None (Preliminary result)   Collection Time: 12/23/16  9:56 AM  Result Value Ref Range Status   Specimen Description BLOOD LEFT AC  Final   Special Requests BOTTLES DRAWN AEROBIC AND ANAEROBIC BCLV  Final   Culture NO GROWTH 2 DAYS  Final   Report Status PENDING  Incomplete  CULTURE, BLOOD (ROUTINE X 2) w Reflex to ID Panel     Status: None (Preliminary result)   Collection Time: 12/23/16 11:29 AM  Result Value Ref Range Status   Specimen Description BLOOD LEFT HAND  Final   Special Requests BOTTLES DRAWN AEROBIC AND ANAEROBIC BCLV  Final   Culture NO GROWTH 2 DAYS  Final   Report Status PENDING  Incomplete    RADIOLOGY:  No results found.   Management plans discussed with the patient, family and they are in agreement.  CODE STATUS:     Code Status Orders        Start     Ordered   12/22/16 1253  Full code  Continuous     12/22/16 1252     Code Status History    Date Active Date Inactive Code Status Order ID Comments User  Context   This patient has a current code status but no historical code status.      TOTAL TIME TAKING CARE OF THIS PATIENT: 37 minutes.    Gladstone Lighter M.D on 12/25/2016 at 2:41 PM  Between 7am to 6pm - Pager - [phone]  After 6pm go to [email or web address] - Proofreader  [facility]  Office  [phone]  CC: Primary care physician; Vista Mink, FNP   Note: This dictation was prepared with Dragon dictation along with smaller phrase technology. Any transcriptional errors that result from this process are unintentional.

## 2016-12-25 NOTE — Progress Notes (Signed)
RN verified with Shanon Brow in pharmacy the compatibility and synergism of administering the Toradol and Acetaminophen concurrently for most efrfective pain control.

## 2016-12-28 LAB — CULTURE, BLOOD (ROUTINE X 2)
Culture: NO GROWTH
Culture: NO GROWTH

## 2016-12-29 ENCOUNTER — Encounter
Admit: 2016-12-29 | Discharge: 2016-12-29 | Disposition: A | Discharge status: Home / Self Care | Payer: BLUE CROSS/BLUE SHIELD | Attending: Urology | Admitting: Urology

## 2016-12-29 ENCOUNTER — Telehealth: Payer: Self-pay | Admitting: Urology

## 2016-12-29 DIAGNOSIS — A419 Sepsis, unspecified organism: Secondary | ICD-10-CM | POA: Insufficient documentation

## 2016-12-29 DIAGNOSIS — I10 Essential (primary) hypertension: Secondary | ICD-10-CM | POA: Insufficient documentation

## 2016-12-29 DIAGNOSIS — N201 Calculus of ureter: Secondary | ICD-10-CM | POA: Insufficient documentation

## 2016-12-29 DIAGNOSIS — N200 Calculus of kidney: Secondary | ICD-10-CM

## 2016-12-29 DIAGNOSIS — N1330 Unspecified hydronephrosis: Secondary | ICD-10-CM | POA: Insufficient documentation

## 2016-12-29 DIAGNOSIS — R109 Unspecified abdominal pain: Secondary | ICD-10-CM | POA: Insufficient documentation

## 2016-12-29 DIAGNOSIS — Z01810 Encounter for preprocedural cardiovascular examination: Secondary | ICD-10-CM | POA: Insufficient documentation

## 2016-12-29 HISTORY — DX: Other specified postprocedural states: Z98.890

## 2016-12-29 HISTORY — DX: Nausea with vomiting, unspecified: R11.2

## 2016-12-29 HISTORY — DX: Essential (primary) hypertension: I10

## 2016-12-29 NOTE — Telephone Encounter (Signed)
Patient will need more meds prior to surgery if possible. Can someone call her if this can be done. She will be out tomorrow and has a ride to come and pick it up tomorrow.  Sharyn Lull

## 2016-12-29 NOTE — Telephone Encounter (Signed)
-----   Message from Hollice Espy, MD sent at 12/25/2016  2:02 PM EDT ----- Regarding: RE: post op She doesn't need a post op.  She is booked already to return to the OR and doesn't need to come to clinic at all which I explained to her and wrote in my notes.  The hospitalist don't listen.    Hollice Espy, MD  ----- Message ----- From: Benard Halsted Sent: 12/25/2016   1:51 PM To: Hollice Espy, MD Subject: post op                                        I made this patient an appt for post op but the only thing I have is 12-30-16 with [place]. They couldn't tell me if she needed anything special. Is this appt ok? I know its nit quite a week but we are closed the 5th and 6th.   Does she need anything?   Thanks,  Sharyn Lull

## 2016-12-29 NOTE — Patient Instructions (Signed)
  Your procedure is scheduled on: 01-07-17 ( Wednesday) Report to Same Day Surgery 2nd floor medical mall [facility] Entrance-take elevator on left to 2nd floor.  Check in with surgery information desk.) To find out your arrival time please call [phone] between 1PM - 3PM on 01-06-17 (Tuesday)  Remember: Instructions that are not followed completely may result in serious medical risk, up to and including death, or upon the discretion of your surgeon and anesthesiologist your surgery may need to be rescheduled.    _x___ 1. Do not eat food or drink liquids after midnight. No gum chewing or hard candies.     __x__ 2. No Alcohol for 24 hours before or after surgery.   __x__3. No Smoking for 24 prior to surgery.   ____  4. Bring all medications with you on the day of surgery if instructed.    __x__ 5. Notify your doctor if there is any change in your medical condition     (cold, fever, infections).     Do not wear jewelry, make-up, hairpins, clips or nail polish.  Do not wear lotions, powders, or perfumes. You may wear deodorant.  Do not shave 48 hours prior to surgery. Men may shave face and neck.  Do not bring valuables to the hospital.    [facility] is not responsible for any belongings or valuables.               Contacts, dentures or bridgework may not be worn into surgery.  Leave your suitcase in the car. After surgery it may be brought to your room.  For patients admitted to the hospital, discharge time is determined by your treatment team.   Patients discharged the day of surgery will not be allowed to drive home.  You will need someone to drive you home and stay with you the night of your procedure.    Please read over the following fact sheets that you were given:   [facility] Preparing for Surgery and or MRSA Information   _x___ Take anti-hypertensive (unless it includes a diuretic), cardiac, seizure, asthma, anti-reflux and psychiatric medicines WITH A SMALL SIP OF  WATER. These include:  1. COREG (CARVEDILOL)  2. LOSARTAN (COZAAR)  3. MAY TAKE PERCOCET DAY OF SURGERY IF NEEDED   4.  5.  6.  ____Fleets enema or Magnesium Citrate as directed.   ____ Use CHG Soap or sage wipes as directed on instruction sheet   ____ Use inhalers on the day of surgery and bring to hospital day of surgery  ____ Stop Metformin and Janumet 2 days prior to surgery.    ____ Take 1/2 of usual insulin dose the night before surgery and none on the morning     surgery.   ____ Follow recommendations from Cardiologist, Pulmonologist or PCP regarding stopping Aspirin, Coumadin, Pllavix ,Eliquis, Effient, or Pradaxa, and Pletal.  X____Stop Anti-inflammatories such as Advil, Aleve, Ibuprofen, Motrin, Naproxen, Naprosyn, Goodies powders or aspirin products NOW-OK to take Tylenol OR PERCOCET    ____ Stop supplements until after surgery.     ____ Bring C-Pap to the hospital.

## 2016-12-30 ENCOUNTER — Encounter
Admission: RE | Admit: 2016-12-30 | Discharge: 2016-12-30 | Disposition: A | Discharge status: Home / Self Care | Payer: BLUE CROSS/BLUE SHIELD | Source: Ambulatory Visit | Attending: Urology | Admitting: Urology

## 2016-12-30 ENCOUNTER — Ambulatory Visit: Payer: Self-pay

## 2016-12-30 DIAGNOSIS — Z01810 Encounter for preprocedural cardiovascular examination: Secondary | ICD-10-CM | POA: Diagnosis not present

## 2016-12-30 DIAGNOSIS — N201 Calculus of ureter: Secondary | ICD-10-CM | POA: Diagnosis not present

## 2016-12-30 DIAGNOSIS — I10 Essential (primary) hypertension: Secondary | ICD-10-CM | POA: Diagnosis not present

## 2016-12-30 DIAGNOSIS — N1330 Unspecified hydronephrosis: Secondary | ICD-10-CM | POA: Diagnosis not present

## 2016-12-30 DIAGNOSIS — A419 Sepsis, unspecified organism: Secondary | ICD-10-CM | POA: Diagnosis not present

## 2016-12-30 DIAGNOSIS — R109 Unspecified abdominal pain: Secondary | ICD-10-CM | POA: Diagnosis not present

## 2016-12-30 MED ORDER — OXYCODONE-ACETAMINOPHEN 7.5-325 MG PO TABS: 1.0000 | ORAL_TABLET | Freq: Four times a day (QID) | ORAL | 0 refills | Status: DC | PRN

## 2016-12-30 NOTE — Pre-Procedure Instructions (Signed)
CALLED DR ADAMS REGARDING EKG SHOWING ANTERIOR INFARCT- PT HAD UROLOGY SURGERY 12-22-16 WITH DR Erlene Quan. PT CAME IN THRU THE ED AND PT HAS HTN, CARDIOMYOPATHY AND EKG WAS NEVER DONE AND PT WENT BACK TO OR AND HAD ANESTHESIA. NOW PT COMES IN THE PRE-ADMIT AND PER ANESTHESIA REQUIREMENT FOR HTN EKG WAS DONE AND NOW SHOWS ANTERIOR INFARCT- DR ADAMS CONSULTED WITH DR Rosey Bath AND THEY BOTH SAID PT OK TO PROCEED WITH SURGERY

## 2016-12-30 NOTE — Telephone Encounter (Signed)
Per Dr. Pilar Jarvis ok to refill patient notified script was printed and can be picked up.

## 2017-01-06 DIAGNOSIS — Z7983 Long term (current) use of bisphosphonates: Secondary | ICD-10-CM | POA: Diagnosis not present

## 2017-01-06 DIAGNOSIS — I10 Essential (primary) hypertension: Secondary | ICD-10-CM | POA: Diagnosis not present

## 2017-01-06 DIAGNOSIS — I429 Cardiomyopathy, unspecified: Secondary | ICD-10-CM | POA: Diagnosis not present

## 2017-01-06 DIAGNOSIS — Z79899 Other long term (current) drug therapy: Secondary | ICD-10-CM | POA: Diagnosis not present

## 2017-01-06 DIAGNOSIS — Z87891 Personal history of nicotine dependence: Secondary | ICD-10-CM | POA: Diagnosis not present

## 2017-01-06 DIAGNOSIS — N202 Calculus of kidney with calculus of ureter: Secondary | ICD-10-CM | POA: Diagnosis not present

## 2017-01-06 DIAGNOSIS — N201 Calculus of ureter: Secondary | ICD-10-CM | POA: Diagnosis present

## 2017-01-06 MED ORDER — FAMOTIDINE 20 MG PO TABS
20.0000 mg | ORAL_TABLET | Freq: Once | ORAL | Status: AC
  Administered 2017-01-07: 20 mg via ORAL

## 2017-01-06 MED ORDER — SODIUM CHLORIDE 0.9 % IV SOLN
1.0000 g | Freq: Once | INTRAVENOUS | Status: AC
  Administered 2017-01-07: 1 g via INTRAVENOUS

## 2017-01-06 NOTE — Progress Notes (Signed)
Pharmacy Antibiotic Note  Kim Riley is a 56 y.o. female to be admitted on 11 April 18 for Ureteroscopy w/ Laser Lithotripsy.  Pharmacy has been consulted for Gentamicin  Prophylaxis dosing.  Plan: Pre op Dose Gentamicin 5 mg/kg (total dose 360mg ) ordered to be released for surgery     Allergies  Allergen Reactions  . Sulfa Antibiotics Rash    Could've been caused by sun exposure.  . Vicodin [Hydrocodone-Acetaminophen] Rash      Thank you for allowing pharmacy to be a part of this patient's care.  Kim Riley K 01/06/2017 11:30 AM

## 2017-01-07 ENCOUNTER — Encounter
Admission: RE | Disposition: A | Discharge status: Home / Self Care | Payer: Self-pay | Source: Ambulatory Visit | Attending: Urology

## 2017-01-07 ENCOUNTER — Ambulatory Visit: Payer: BLUE CROSS/BLUE SHIELD | Admitting: Anesthesiology

## 2017-01-07 ENCOUNTER — Ambulatory Visit
Admission: RE | Admit: 2017-01-07 | Discharge: 2017-01-07 | Disposition: A | Discharge status: Home / Self Care | Payer: BLUE CROSS/BLUE SHIELD | Source: Ambulatory Visit | Attending: Urology | Admitting: Urology

## 2017-01-07 ENCOUNTER — Encounter: Payer: Self-pay | Admitting: *Deleted

## 2017-01-07 DIAGNOSIS — I10 Essential (primary) hypertension: Secondary | ICD-10-CM | POA: Insufficient documentation

## 2017-01-07 DIAGNOSIS — Z7983 Long term (current) use of bisphosphonates: Secondary | ICD-10-CM | POA: Insufficient documentation

## 2017-01-07 DIAGNOSIS — Z79899 Other long term (current) drug therapy: Secondary | ICD-10-CM | POA: Insufficient documentation

## 2017-01-07 DIAGNOSIS — N201 Calculus of ureter: Secondary | ICD-10-CM

## 2017-01-07 DIAGNOSIS — N202 Calculus of kidney with calculus of ureter: Secondary | ICD-10-CM | POA: Diagnosis not present

## 2017-01-07 DIAGNOSIS — I429 Cardiomyopathy, unspecified: Secondary | ICD-10-CM | POA: Insufficient documentation

## 2017-01-07 DIAGNOSIS — Z87891 Personal history of nicotine dependence: Secondary | ICD-10-CM | POA: Insufficient documentation

## 2017-01-07 HISTORY — PX: CYSTOSCOPY W/ URETERAL STENT PLACEMENT: SHX1429

## 2017-01-07 HISTORY — PX: URETEROSCOPY WITH HOLMIUM LASER LITHOTRIPSY: SHX6645

## 2017-01-07 LAB — URINE DRUG SCREEN, QUALITATIVE (ARMC ONLY)
AMPHETAMINES, UR SCREEN: NOT DETECTED
Barbiturates, Ur Screen: NOT DETECTED
Benzodiazepine, Ur Scrn: POSITIVE — AB
Cannabinoid 50 Ng, Ur ~~LOC~~: POSITIVE — AB
Cocaine Metabolite,Ur ~~LOC~~: NOT DETECTED
MDMA (ECSTASY) UR SCREEN: NOT DETECTED
METHADONE SCREEN, URINE: NOT DETECTED
Opiate, Ur Screen: NOT DETECTED
PHENCYCLIDINE (PCP) UR S: NOT DETECTED
Tricyclic, Ur Screen: NOT DETECTED

## 2017-01-07 SURGERY — URETEROSCOPY, WITH LITHOTRIPSY USING HOLMIUM LASER
Anesthesia: General | Site: Ureter | Laterality: Right | Wound class: Clean Contaminated

## 2017-01-07 MED ORDER — IOTHALAMATE MEGLUMINE 43 % IV SOLN
INTRAVENOUS | Status: DC | PRN
  Administered 2017-01-07: 20 mL

## 2017-01-07 MED ORDER — DOCUSATE SODIUM 100 MG PO CAPS: 100.0000 mg | ORAL_CAPSULE | Freq: Two times a day (BID) | ORAL | 0 refills | Status: DC

## 2017-01-07 MED ORDER — LACTATED RINGERS IV SOLN
INTRAVENOUS | Status: DC
  Administered 2017-01-07: 13:00:00 via INTRAVENOUS

## 2017-01-07 MED ORDER — MIDAZOLAM HCL 2 MG/2ML IJ SOLN
INTRAMUSCULAR | Status: DC | PRN
  Administered 2017-01-07: 2 mg via INTRAVENOUS

## 2017-01-07 MED ORDER — ONDANSETRON HCL 4 MG/2ML IJ SOLN
INTRAMUSCULAR | Status: DC | PRN
  Administered 2017-01-07: 4 mg via INTRAVENOUS

## 2017-01-07 MED ORDER — SUGAMMADEX SODIUM 200 MG/2ML IV SOLN
INTRAVENOUS | Status: DC | PRN
  Administered 2017-01-07: 300 mg via INTRAVENOUS

## 2017-01-07 MED ORDER — PHENYLEPHRINE HCL 10 MG/ML IJ SOLN
INTRAMUSCULAR | Status: DC | PRN
  Administered 2017-01-07 (×3): 100 ug via INTRAVENOUS

## 2017-01-07 MED ORDER — LIDOCAINE HCL (CARDIAC) 20 MG/ML IV SOLN
INTRAVENOUS | Status: DC | PRN
  Administered 2017-01-07: 100 mg via INTRAVENOUS

## 2017-01-07 MED ORDER — DEXAMETHASONE SODIUM PHOSPHATE 4 MG/ML IJ SOLN
INTRAMUSCULAR | Status: DC | PRN
  Administered 2017-01-07: 10 mg via INTRAVENOUS

## 2017-01-07 MED ORDER — EPHEDRINE SULFATE 50 MG/ML IJ SOLN
INTRAMUSCULAR | Status: DC | PRN
Start: 2017-01-07 — End: 2017-01-07
  Administered 2017-01-07: 10 mg via INTRAVENOUS
  Administered 2017-01-07: 5 mg via INTRAVENOUS

## 2017-01-07 MED ORDER — OXYCODONE-ACETAMINOPHEN 5-325 MG PO TABS: 1.0000 | ORAL_TABLET | ORAL | 0 refills | Status: DC | PRN

## 2017-01-07 MED ORDER — FAMOTIDINE 20 MG PO TABS
ORAL_TABLET | ORAL | Status: DC
Start: 2017-01-07 — End: 2017-01-07
  Filled 2017-01-07: qty 1

## 2017-01-07 MED ORDER — FENTANYL CITRATE (PF) 100 MCG/2ML IJ SOLN
INTRAMUSCULAR | Status: DC | PRN
  Administered 2017-01-07: 250 ug via INTRAVENOUS

## 2017-01-07 MED ORDER — SODIUM CHLORIDE 0.9 % IV SOLN
INTRAVENOUS | Status: AC
  Filled 2017-01-07: qty 1000

## 2017-01-07 MED ORDER — GENTAMICIN SULFATE 40 MG/ML IJ SOLN: 5.0000 mg/kg | Freq: Once | INTRAVENOUS | Status: DC

## 2017-01-07 MED ORDER — FENTANYL CITRATE (PF) 100 MCG/2ML IJ SOLN: 25.0000 ug | INTRAMUSCULAR | Status: DC | PRN

## 2017-01-07 MED ORDER — GENTAMICIN SULFATE 40 MG/ML IJ SOLN
360.0000 mg | Freq: Once | INTRAVENOUS | Status: AC
  Administered 2017-01-07: 360 mg via INTRAVENOUS
  Filled 2017-01-07: qty 9

## 2017-01-07 MED ORDER — ROCURONIUM BROMIDE 100 MG/10ML IV SOLN
INTRAVENOUS | Status: DC | PRN
  Administered 2017-01-07: 50 mg via INTRAVENOUS
  Administered 2017-01-07: 10 mg via INTRAVENOUS

## 2017-01-07 MED ORDER — PROPOFOL 10 MG/ML IV BOLUS
INTRAVENOUS | Status: DC | PRN
  Administered 2017-01-07: 150 mg via INTRAVENOUS

## 2017-01-07 MED ORDER — OXYBUTYNIN CHLORIDE 5 MG PO TABS: 5.0000 mg | ORAL_TABLET | Freq: Three times a day (TID) | ORAL | 0 refills | Status: DC | PRN

## 2017-01-07 SURGICAL SUPPLY — 31 items
BAG DRAIN CYSTO-URO LG1000N (MISCELLANEOUS) ×2 IMPLANT
BASKET ZERO TIP 1.9FR (BASKET) ×2 IMPLANT
CATH URETL 5X70 OPEN END (CATHETERS) ×2 IMPLANT
CNTNR SPEC 2.5X3XGRAD LEK (MISCELLANEOUS)
CONRAY 43 FOR UROLOGY 50M (MISCELLANEOUS) ×2 IMPLANT
CONT SPEC 4OZ STER OR WHT (MISCELLANEOUS)
CONTAINER SPEC 2.5X3XGRAD LEK (MISCELLANEOUS) IMPLANT
DRAPE UTILITY 15X26 TOWEL STRL (DRAPES) ×2 IMPLANT
FIBER LASER LITHO 273 (Laser) ×2 IMPLANT
GLOVE BIO SURGEON STRL SZ 6.5 (GLOVE) ×2 IMPLANT
GOWN STRL REUS W/ TWL LRG LVL3 (GOWN DISPOSABLE) ×2 IMPLANT
GOWN STRL REUS W/TWL LRG LVL3 (GOWN DISPOSABLE) ×2
GUIDEWIRE GREEN .038 145CM (MISCELLANEOUS) ×2 IMPLANT
GUIDEWIRE SUPER STIFF (WIRE) ×2 IMPLANT
INFUSOR MANOMETER BAG 3000ML (MISCELLANEOUS) ×2 IMPLANT
INTRODUCER DILATOR DOUBLE (INTRODUCER) IMPLANT
KIT RM TURNOVER CYSTO AR (KITS) ×2 IMPLANT
PACK CYSTO AR (MISCELLANEOUS) ×2 IMPLANT
SCRUB POVIDONE IODINE 4 OZ (MISCELLANEOUS) IMPLANT
SENSORWIRE 0.038 NOT ANGLED (WIRE) ×2
SET CYSTO W/LG BORE CLAMP LF (SET/KITS/TRAYS/PACK) ×2 IMPLANT
SHEATH URETERAL 12FRX35CM (MISCELLANEOUS) IMPLANT
SOL .9 NS 3000ML IRR  AL (IV SOLUTION) ×1
SOL .9 NS 3000ML IRR UROMATIC (IV SOLUTION) ×1 IMPLANT
STENT URET 6FRX24 CONTOUR (STENTS) ×2 IMPLANT
STENT URET 6FRX26 CONTOUR (STENTS) IMPLANT
SURGILUBE 2OZ TUBE FLIPTOP (MISCELLANEOUS) ×2 IMPLANT
SYR 30ML LL (SYRINGE) ×2 IMPLANT
SYRINGE IRR TOOMEY STRL 70CC (SYRINGE) ×2 IMPLANT
WATER STERILE IRR 1000ML POUR (IV SOLUTION) ×2 IMPLANT
WIRE SENSOR 0.038 NOT ANGLED (WIRE) ×1 IMPLANT

## 2017-01-07 NOTE — Anesthesia Postprocedure Evaluation (Signed)
Anesthesia Post Note  Patient: Kim Riley  Procedure(s) Performed: Procedure(s) (LRB): URETEROSCOPY WITH HOLMIUM LASER LITHOTRIPSY (Right) CYSTOSCOPY WITH STENT REPLACEMENT (Right)  Patient location during evaluation: PACU Anesthesia Type: General Level of consciousness: awake and alert and oriented Pain management: pain level controlled Vital Signs Assessment: post-procedure vital signs reviewed and stable Respiratory status: spontaneous breathing, nonlabored ventilation and respiratory function stable Cardiovascular status: blood pressure returned to baseline and stable Postop Assessment: no signs of nausea or vomiting Anesthetic complications: no     Last Vitals:  Vitals:   01/07/17 1638 01/07/17 1651  BP: (!) 139/93 (!) 128/92  Pulse: 61 63  Resp: 13 14  Temp: 36.6 C 36.3 C    Last Pain:  Vitals:   01/07/17 1651  TempSrc: Temporal  PainSc:                  Samyak Sackmann

## 2017-01-07 NOTE — Transfer of Care (Signed)
Immediate Anesthesia Transfer of Care Note  Patient: Kim Riley  Procedure(s) Performed: Procedure(s): URETEROSCOPY WITH HOLMIUM LASER LITHOTRIPSY (Right) CYSTOSCOPY WITH STENT REPLACEMENT (Right)  Patient Location: PACU  Anesthesia Type:General  Level of Consciousness: sedated  Airway & Oxygen Therapy: Patient Spontanous Breathing and Patient connected to face mask oxygen  Post-op Assessment: Report given to RN and Post -op Vital signs reviewed and stable  Post vital signs: Reviewed and stable  Last Vitals:  Vitals:   01/07/17 1211 01/07/17 1558  BP: 109/78 (!) 143/92  Pulse: 77 67  Resp: 18 13  Temp: 36.7 C 15.9 C    Complications: No apparent anesthesia complications

## 2017-01-07 NOTE — Anesthesia Post-op Follow-up Note (Cosign Needed)
Anesthesia QCDR form completed.        

## 2017-01-07 NOTE — Op Note (Signed)
Date of procedure: 01/07/17  Preoperative diagnosis:  1. Right ureteral stone 2. Right nonobstructing nephrolithiasis 3. History of right pyelonephritis/urosepsis   Postoperative diagnosis:  1. Same as above   Procedure: 1. Right ureteroscopy 2. Laser lithotripsy 3. Right ureteral stent exchange 4. Basket extraction of Stone fragment 5. Right retrograde pyelogram  Surgeon: Hollice Espy, MD  Anesthesia: General  Complications: None  Intraoperative findings: Large 1 cm stone in right distal ureter treated. Several 3-4 mm stones in the kidney were also addressed on this session.  EBL: Minimal  Specimens: Stone fragment  Drains: 6 x 24 French double-J ureteral stent  Indication: Kim Riley is a 56 y.o. patient with an obstructing 1 cm right distal ureteral stone who initially presented with urosepsis who underwent emergent ureteral stent placement. She returns today for definitive management of her ureteral stone as well as to adress some small nonobstructing calculi on the right..  After reviewing the management options for treatment, he elected to proceed with the above surgical procedure(s). We have discussed the potential benefits and risks of the procedure, side effects of the proposed treatment, the likelihood of the patient achieving the goals of the procedure, and any potential problems that might occur during the procedure or recuperation. Informed consent has been obtained.  Description of procedure:  The patient was taken to the operating room and general anesthesia was induced.  The patient was placed in the dorsal lithotomy position, prepped and draped in the usual sterile fashion, and preoperative antibiotics were administered. A preoperative time-out was performed.   A 21 French scope was advanced per urethra into the bladder. Attention was turned to the right ureteral orifice from which a ureteral stent was seen emanating. The distal coil of the stent was  grasped using stent graspers and brought to level of the urethral meatus. This is then cannulated using a sensor wire. The wire was left in place and the stent was removed. A 4.5 French semirigid ureteroscope was then advanced to the distal ureter where the 1 cm stone was identified. A 273  laser fiber was then brought in and using the settings of 0.8 J and 10 Hz, the stone was fragmented into proximally 10 or so smaller pieces. Each of these pieces were then extracted using a 1.9 [place] to plus nitinol basket. Once the ureter was adequately cleared of stone, a second Super Stiff wire was introduced all the way up to level of the kidney. The semirigid ureteroscope was then exchanged for a flexible ureteroscope which was able to be advanced over the working wire kidney safety wire place. Formal pyeloscopy was then performed. A few proximally 3-4 smaller 3-4 mm millimeter stones were encountered in the mid and lower poles of the right kidney. These were addressed using dusting stone settings of 0.2 J and 40 Hz. Once the kidney was adequately cleared of all stone burden in each never calyx was identified, the scope was backed to the UPJ were retrograde pyelogram was performed. This created a roadmap of the kidney. There was no extravasation of contrast or filling defects. The scope was then backed down the length of the ureter inspecting along the way. There were no additional ureteral stone fragments or ureteral injuries appreciated. There was some mild edema the stone up in previously lodged. A 6 x 24 French double-J ureteral stent was then advanced over the safety wire up to level of the renal pelvis. The wire was partially drawn until full coil was noted within the  renal pelvis. The wire was then fully withdrawn and a full coil was noted within the kidney. The bladder was then drained. Several stone fragments were captured and sent for stone analysis. The patient was then cleaned and dried, repositioned the supine  position, reversed from anesthesia, taken to the PACU in stable condition.  Plan: Patient will follow up next week for cystoscopy, stent removal.  Hollice Espy, M.D.

## 2017-01-07 NOTE — Interval H&P Note (Signed)
History and Physical Interval Note:  01/07/2017 2:08 PM  [facility] Kim Riley  has presented today for surgery, with the diagnosis of RIGHT URETERAL STONE  The various methods of treatment have been discussed with the patient and family. After consideration of risks, benefits and other options for treatment, the patient has consented to  Procedure(s): URETEROSCOPY WITH HOLMIUM LASER LITHOTRIPSY (N/A) CYSTOSCOPY WITH STENT REPLACEMENT (N/A) as a surgical intervention .  The patient's history has been reviewed, patient examined, no change in status, stable for surgery.  I have reviewed the patient's chart and labs.  Questions were answered to the patient's satisfaction.    Plan for definitive management of right sided stone burden.  Risks and benefits of ureteroscopy were reviewed including but not limited to infection, bleeding, pain, ureteral injury which could require open surgery versus prolonged indwelling if ureteralperforation occurs, persistent stone disease, requirement for staged procedure, possible stent, and global anesthesia risks. Patient expressed understanding and desires to proceed with ureteroscopy.  Previously grew pansensitive E. Coli, treated adequately.    RRR CTAB  Hollice Espy

## 2017-01-07 NOTE — Anesthesia Procedure Notes (Signed)
Procedure Name: Intubation Date/Time: 01/07/2017 2:37 PM Performed by: Rosaria Ferries, Berlie Persky Pre-anesthesia Checklist: Patient identified, Emergency Drugs available, Suction available and Patient being monitored Patient Re-evaluated:Patient Re-evaluated prior to inductionOxygen Delivery Method: Circle system utilized Preoxygenation: Pre-oxygenation with 100% oxygen Intubation Type: IV induction Laryngoscope Size: Mac and 3 Grade View: Grade II Tube size: 7.0 mm Number of attempts: 1 Airway Equipment and Method: Stylet Placement Confirmation: ETT inserted through vocal cords under direct vision,  positive ETCO2 and breath sounds checked- equal and bilateral Secured at: 21 cm Tube secured with: Tape Dental Injury: Bloody posterior oropharynx

## 2017-01-07 NOTE — Anesthesia Preprocedure Evaluation (Signed)
Anesthesia Evaluation  Patient identified by MRN, date of birth, ID band Patient awake    Reviewed: Allergy & Precautions, H&P , NPO status , Patient's Chart, lab work & pertinent test results  History of Anesthesia Complications (+) PONV, PROLONGED EMERGENCE and history of anesthetic complications  Airway Mallampati: III  TM Distance: <3 FB Neck ROM: full    Dental  (+) Chipped, Caps, Poor Dentition   Pulmonary neg shortness of breath, former smoker,    Pulmonary exam normal breath sounds clear to auscultation       Cardiovascular Exercise Tolerance: Good hypertension, (-) angina+CHF  (-) Past MI and (-) DOE Normal cardiovascular exam Rhythm:regular Rate:Normal     Neuro/Psych negative neurological ROS  negative psych ROS   GI/Hepatic negative GI ROS, Neg liver ROS, neg GERD  ,  Endo/Other  negative endocrine ROS  Renal/GU Renal disease     Musculoskeletal   Abdominal   Peds  Hematology negative hematology ROS (+)   Anesthesia Other Findings Signs and symptoms suggestive of sleep apnea   Past Medical History: No date: Cardiomyopathy ([place]) No date: Hypertension No date: Kidney stone No date: PONV (postoperative nausea and vomiting)     Comment: HARD TO WAKE UP 20 YEARS AGO FOR KIDNEY               STONE-NO PROBLEM WITH 12-22-16 SURGERY  Past Surgical History: 12/22/2016: CYSTOSCOPY W/ RETROGRADES Right     Comment: Procedure: CYSTOSCOPY WITH RETROGRADE               PYELOGRAM;  Surgeon: Hollice Espy, MD;                Location: [facility] ORS;  Service: Urology;                Laterality: Right; 12/22/2016: CYSTOSCOPY WITH STENT PLACEMENT Right     Comment: Procedure: CYSTOSCOPY WITH STENT PLACEMENT;                Surgeon: Hollice Espy, MD;  Location: [facility]               ORS;  Service: Urology;  Laterality: Right;     Reproductive/Obstetrics negative OB ROS                              Anesthesia Physical Anesthesia Plan  ASA: III  Anesthesia Plan: General ETT   Post-op Pain Management:    Induction:   Airway Management Planned:   Additional Equipment:   Intra-op Plan:   Post-operative Plan:   Informed Consent: I have reviewed the patients History and Physical, chart, labs and discussed the procedure including the risks, benefits and alternatives for the proposed anesthesia with the patient or authorized representative who has indicated his/her understanding and acceptance.   Dental Advisory Given  Plan Discussed with: Anesthesiologist, CRNA and Surgeon  Anesthesia Plan Comments:         Anesthesia Quick Evaluation

## 2017-01-07 NOTE — H&P (View-Only) (Signed)
Consult: Right hydronephrosis, right ureteral stone Requested by: Dr. Merlyn Lot    History of Present Illness: 56 year old white female who developed right flank pain and presented to the emergency department. She underwent a CT scan of the abdomen and pelvis which showed a 4 mm right lower pole stone and a 10 mm right mid to distal stone with hydronephrosis. Her white count was 18 and UA showed many bacteria. She continues to feel poorly despite fluids antibiotics and pain medicine in the emergency department. She was admitted for ureteral stent in observation.  She has a history of kidney stones remotely and says she had a ureteral stent for "6 months". 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 Lottie Rater at [facility] urology.  Past Medical History:  Diagnosis Date  . Cardiomyopathy ([place])   . Kidney stone    History reviewed. No pertinent surgical history.  Home Medications:  Prescriptions Prior to Admission  Medication Sig Dispense Refill Last Dose  . alendronate (FOSAMAX) 70 MG tablet Take 1 tablet by mouth once a week.  1   . carvedilol (COREG) 6.25 MG tablet Take 1 tablet by mouth 2 (two) times daily.  9   . losartan (COZAAR) 50 MG tablet Take 1 tablet by mouth daily.  0    Allergies:  Allergies  Allergen Reactions  . Vicodin [Hydrocodone-Acetaminophen]   . Sulfa Antibiotics Rash    Family History  Problem Relation Age of Onset  . HIV Mother   . Brain cancer Father    Social History:  reports that she has never smoked. She has never used smokeless tobacco. She reports that she does not drink alcohol. Her drug history is not on file.  ROS: A complete review of systems was performed.  All systems are negative except for pertinent findings as noted. ROS   Physical Exam:  Vital signs in last 24 hours: Temp:  [97.5 F (36.4 C)-98 F (36.7 C)] 98 F (36.7 C) (03/26 1251) Pulse Rate:  [59-88] 87  (03/26 1251) Resp:  [24-28] 24 (03/26 1118) BP: (112-168)/(45-101) 112/64 (03/26 1251) SpO2:  [97 %-100 %] 99 % (03/26 1251) Weight:  [72.6 kg (160 lb)] 72.6 kg (160 lb) (03/26 0718) General:  Alert and oriented, No acute distress, ill-appearing HEENT: Normocephalic, atraumatic Neck: No JVD or lymphadenopathy Cardiovascular: Regular rate and rhythm Lungs: Regular rate and effort Abdomen: Soft, nontender, nondistended, no abdominal masses Back: No CVA tenderness Extremities: No edema Neurologic: Grossly intact  Laboratory Data:  Results for orders placed or performed during the hospital encounter of 12/22/16 (from the past 24 hour(s))  CBC with Differential/Platelet     Status: Abnormal   Collection Time: 12/22/16  7:31 AM  Result Value Ref Range   WBC 18.1 (H) 3.6 - 11.0 K/uL   RBC 4.31 3.80 - 5.20 MIL/uL   Hemoglobin 14.0 12.0 - 16.0 g/dL   HCT 40.4 35.0 - 47.0 %   MCV 93.7 80.0 - 100.0 fL   MCH 32.4 26.0 - 34.0 pg   MCHC 34.5 32.0 - 36.0 g/dL   RDW 12.3 11.5 - 14.5 %   Platelets 261 150 - 440 K/uL   Neutrophils Relative % 82 %   Neutro Abs 14.8 (H) 1.4 - 6.5 K/uL   Lymphocytes Relative 14 %   Lymphs Abs 2.6 1.0 - 3.6 K/uL   Monocytes Relative 4 %   Monocytes Absolute 0.7 0.2 -  0.9 K/uL   Eosinophils Relative 0 %   Eosinophils Absolute 0.0 0 - 0.7 K/uL   Basophils Relative 0 %   Basophils Absolute 0.1 0 - 0.1 K/uL  Comprehensive metabolic panel     Status: Abnormal   Collection Time: 12/22/16  7:31 AM  Result Value Ref Range   Sodium 133 (L) 135 - 145 mmol/L   Potassium 3.5 3.5 - 5.1 mmol/L   Chloride 100 (L) 101 - 111 mmol/L   CO2 22 22 - 32 mmol/L   Glucose, Bld 150 (H) 65 - 99 mg/dL   BUN 19 6 - 20 mg/dL   Creatinine, Ser 0.71 0.44 - 1.00 mg/dL   Calcium 9.4 8.9 - 10.3 mg/dL   Total Protein 7.4 6.5 - 8.1 g/dL   Albumin 4.8 3.5 - 5.0 g/dL   AST 26 15 - 41 U/L   ALT 19 14 - 54 U/L   Alkaline Phosphatase 44 38 - 126 U/L   Total Bilirubin 0.8 0.3 - 1.2 mg/dL    GFR calc non Af Amer >60 >60 mL/min   GFR calc Af Amer >60 >60 mL/min   Anion gap 11 5 - 15  Urinalysis, Complete w Microscopic     Status: Abnormal   Collection Time: 12/22/16  7:56 AM  Result Value Ref Range   Color, Urine YELLOW (A) YELLOW   APPearance HAZY (A) CLEAR   Specific Gravity, Urine 1.038 (H) 1.005 - 1.030   pH 5.0 5.0 - 8.0   Glucose, UA NEGATIVE NEGATIVE mg/dL   Hgb urine dipstick MODERATE (A) NEGATIVE   Bilirubin Urine NEGATIVE NEGATIVE   Ketones, ur 5 (A) NEGATIVE mg/dL   Protein, ur 30 (A) NEGATIVE mg/dL   Nitrite NEGATIVE NEGATIVE   Leukocytes, UA SMALL (A) NEGATIVE   RBC / HPF 6-30 0 - 5 RBC/hpf   WBC, UA 6-30 0 - 5 WBC/hpf   Bacteria, UA MANY (A) NONE SEEN   Squamous Epithelial / LPF 0-5 (A) NONE SEEN   Mucous PRESENT    No results found for this or any previous visit (from the past 240 hour(s)). Creatinine:  Recent Labs  12/22/16 0731  CREATININE 0.71    Impression/Assessment/plan: Right ureteral stone-she continues to feel poorly and I discussed with the patient the nature, potential benefits, risks and alternatives to cystoscopy, right retrograde pyelogram and right ureteral stent placement, including side effects of the proposed treatment, the likelihood of the patient achieving the goals of the procedure, and any potential problems that might occur during the procedure or recuperation.  We discussed the rationale for a staged procedure with URS at a later date. Also discussed my colleague Dr. Erlene Quan will be doing the procedure. All questions answered. Patient elects to proceed.    Sherral Dirocco 12/22/2016, 1:14 PM

## 2017-01-07 NOTE — Discharge Instructions (Signed)
You have a ureteral stent in place.  This is a tube that extends from your kidney to your bladder.  This may cause urinary bleeding, burning with urination, and urinary frequency.  Please call our office or present to the ED if you develop fevers >101 or pain which is not able to be controlled with oral pain medications.  You may be given either Flomax and/ or ditropan to help with bladder spasms and stent pain in addition to pain medications.    [facility] [address], [place], [place] [address] [phone]  General Anesthesia, Adult, Care After These instructions provide you with information about caring for yourself after your procedure. Your health care provider may also give you more specific instructions. Your treatment has been planned according to current medical practices, but problems sometimes occur. Call your health care provider if you have any problems or questions after your procedure. What can I expect after the procedure? After the procedure, it is common to have:  Vomiting.  A sore throat.  Mental slowness. It is common to feel:  Nauseous.  Cold or shivery.  Sleepy.  Tired.  Sore or achy, even in parts of your body where you did not have surgery. Follow these instructions at home: For at least 24 hours after the procedure:   Do not:  Participate in activities where you could fall or become injured.  Drive.  Use heavy machinery.  Drink alcohol.  Take sleeping pills or medicines that cause drowsiness.  Make important decisions or sign legal documents.  Take care of children on your own.  Rest. Eating and drinking   If you vomit, drink water, juice, or soup when you can drink without vomiting.  Drink enough fluid to keep your urine clear or pale yellow.  Make sure you have little or no nausea before eating solid foods.  Follow the diet recommended by your health care provider. General instructions   Have  a responsible adult stay with you until you are awake and alert.  Return to your normal activities as told by your health care provider. Ask your health care provider what activities are safe for you.  Take over-the-counter and prescription medicines only as told by your health care provider.  If you smoke, do not smoke without supervision.  Keep all follow-up visits as told by your health care provider. This is important. Contact a health care provider if:  You continue to have nausea or vomiting at home, and medicines are not helpful.  You cannot drink fluids or start eating again.  You cannot urinate after 8-12 hours.  You develop a skin rash.  You have fever.  You have increasing redness at the site of your procedure. Get help right away if:  You have difficulty breathing.  You have chest pain.  You have unexpected bleeding.  You feel that you are having a life-threatening or urgent problem. This information is not intended to replace advice given to you by your health care provider. Make sure you discuss any questions you have with your health care provider. Document Released: 12/22/2000 Document Revised: 02/18/2016 Document Reviewed: 08/30/2015 Elsevier Interactive Patient Education  2017 [employer].

## 2017-01-07 NOTE — Progress Notes (Signed)
Dr. Amie Critchley aware of uds results.  No further orders.

## 2017-01-08 ENCOUNTER — Encounter: Payer: Self-pay | Admitting: Urology

## 2017-01-13 ENCOUNTER — Encounter: Payer: Self-pay | Admitting: Urology

## 2017-01-13 ENCOUNTER — Ambulatory Visit (INDEPENDENT_AMBULATORY_CARE_PROVIDER_SITE_OTHER): Payer: BLUE CROSS/BLUE SHIELD | Admitting: Urology

## 2017-01-13 VITALS — BP 125/80 | HR 73 | Ht 68.0 in | Wt 160.0 lb

## 2017-01-13 DIAGNOSIS — N201 Calculus of ureter: Secondary | ICD-10-CM | POA: Diagnosis not present

## 2017-01-13 DIAGNOSIS — D3002 Benign neoplasm of left kidney: Secondary | ICD-10-CM

## 2017-01-13 DIAGNOSIS — D1771 Benign lipomatous neoplasm of kidney: Secondary | ICD-10-CM

## 2017-01-13 DIAGNOSIS — N200 Calculus of kidney: Secondary | ICD-10-CM

## 2017-01-13 LAB — MICROSCOPIC EXAMINATION
Bacteria, UA: NONE SEEN
RBC, UA: NONE SEEN /hpf (ref 0–?)
WBC, UA: NONE SEEN /hpf (ref 0–?)

## 2017-01-13 LAB — URINALYSIS, COMPLETE
BILIRUBIN UA: NEGATIVE
GLUCOSE, UA: NEGATIVE
KETONES UA: NEGATIVE
LEUKOCYTES UA: NEGATIVE
Nitrite, UA: NEGATIVE
PROTEIN UA: NEGATIVE
Urobilinogen, Ur: 0.2 mg/dL (ref 0.2–1.0)
pH, UA: 6.5 (ref 5.0–7.5)

## 2017-01-13 MED ORDER — CIPROFLOXACIN HCL 500 MG PO TABS
500.0000 mg | ORAL_TABLET | Freq: Once | ORAL | Status: AC
  Administered 2017-01-13: 500 mg via ORAL

## 2017-01-13 MED ORDER — LIDOCAINE HCL 2 % EX GEL
1.0000 "application " | Freq: Once | CUTANEOUS | Status: AC
  Administered 2017-01-13: 1 via URETHRAL

## 2017-01-13 NOTE — Progress Notes (Signed)
   01/13/17  CC:  Chief Complaint  Patient presents with  . Cysto Stent Removal    HPI: 56 year old female who presents today for cystoscopy, right ureteral stent removal. She underwent ureteroscopy with laser lithotripsy on 01/07/2017. Surgery was uncomplicated. A 1 cm right distal ureteral stone was treated along with several nonobstructing stones on the same side.  She does have irritation from her son including urinary frequency and urgency. UA today nitrate negative, otherwise consistent with known stent.  NED. A&Ox3.   No respiratory distress   Abd soft, NT, ND Normal external genitalia with patent urethral meatus  Cystoscopy/ Stent removal procedure  Patient identification was confirmed, informed consent was obtained, and patient was prepped using Betadine solution.  Lidocaine jelly was administered per urethral meatus.    Preoperative abx where received prior to procedure.    Procedure: - Flexible cystoscope introduced, without any difficulty.   - Thorough search of the bladder revealed:    normal urethral meatus  Stent seen emanating from right ureteral orifice, grasped with stent graspers, and removed in entirety.    Post-Procedure: - Patient tolerated the procedure well   Assessment/ Plan:  1. Ureterolithiasis s/p R ureteroscopy Stent removed today Warning symptoms reviewed f/u in 4 weeks with RUS prior - Urinalysis, Complete - ciprofloxacin (CIPRO) tablet 500 mg; Take 1 tablet (500 mg total) by mouth once. - lidocaine (XYLOCAINE) 2 % jelly 1 application; Place 1 application into the urethra once. - US Renal; Future  2. Nephrolithiasis s/p R URS Will discuss stone prevention techniques at next visit  3. Right AML Will follow Stable in size   Return in about 4 weeks (around 02/10/2017) for f/u renal ultrasound.     Hollice Espy, MD

## 2017-01-16 LAB — STONE ANALYSIS
CA OXALATE, MONOHYDR.: 93 %
Ca phos cry stone ql IR: 7 %
STONE WEIGHT KSTONE: 86.1 mg

## 2017-02-05 ENCOUNTER — Ambulatory Visit
Admission: RE | Admit: 2017-02-05 | Discharge: 2017-02-05 | Disposition: A | Discharge status: Home / Self Care | Payer: BLUE CROSS/BLUE SHIELD | Source: Ambulatory Visit | Attending: Urology | Admitting: Urology

## 2017-02-05 DIAGNOSIS — Z09 Encounter for follow-up examination after completed treatment for conditions other than malignant neoplasm: Secondary | ICD-10-CM | POA: Insufficient documentation

## 2017-02-05 DIAGNOSIS — N289 Disorder of kidney and ureter, unspecified: Secondary | ICD-10-CM | POA: Diagnosis not present

## 2017-02-05 DIAGNOSIS — N201 Calculus of ureter: Secondary | ICD-10-CM

## 2017-02-13 ENCOUNTER — Ambulatory Visit (INDEPENDENT_AMBULATORY_CARE_PROVIDER_SITE_OTHER): Payer: BLUE CROSS/BLUE SHIELD | Admitting: Urology

## 2017-02-13 ENCOUNTER — Encounter: Payer: Self-pay | Admitting: Urology

## 2017-02-13 VITALS — BP 140/92 | HR 86 | Ht 68.0 in | Wt 159.0 lb

## 2017-02-13 DIAGNOSIS — Z87442 Personal history of urinary calculi: Secondary | ICD-10-CM | POA: Diagnosis not present

## 2017-02-13 DIAGNOSIS — R109 Unspecified abdominal pain: Secondary | ICD-10-CM

## 2017-02-13 DIAGNOSIS — D1771 Benign lipomatous neoplasm of kidney: Secondary | ICD-10-CM

## 2017-02-13 DIAGNOSIS — D3002 Benign neoplasm of left kidney: Secondary | ICD-10-CM | POA: Diagnosis not present

## 2017-02-13 NOTE — Progress Notes (Signed)
02/13/2017 3:43 PM   Kim Riley [birth year] [id number]  Referring provider: Remi Haggard, [place], [place] [address]  Chief Complaint  Patient presents with  . Results    4wk    HPI: 48 old female who presents today for routine follow-up for history of right nephrolithiasis. She initially presented to the emergency room and was admitted with an obstructing large 1 cm right distal ureteral stone. She underwent stenting followed by staged ureteroscopy completed on 01/07/2017. Several upper tract nonobstructing stones were also addressed at this time. Her stent was removed approximately one week following the procedure.  Follow-up renal ultrasounds shows no right-sided residual hydronephrosis or stones.  Reports that over the past week, she is developed left flank pain. No stones are previously seen on CT scan on that side.  She does have a remote history of kidney stones prior to this.   Stone analysis consistent with 93% calcium oxalate monohydrate, 7% calcium phosphate.  She also has a personal history of a left AML and has been followed by serial MRIs by Dr. Lottie Rater at [facility] urology.  This lesion measured 1.2 cm on most recent CT scan. She also has an ill-defined exophytic nodule along the central sinus which is stable measuring 1.8 x 5 cm.  Her most recent MRI on 05/19/2016 performed at [facility] which shows that the lesions have been stable since at least 2015. Per the report, they're most compatible with AML x2.   PMH: Past Medical History:  Diagnosis Date  . Cardiomyopathy ([place])   . Hypertension   . Kidney stone   . PONV (postoperative nausea and vomiting)    HARD TO WAKE UP 20 YEARS AGO FOR KIDNEY STONE-NO PROBLEM WITH 12-22-16 SURGERY    Surgical History: Past Surgical History:  Procedure Laterality Date  . CYSTOSCOPY W/ RETROGRADES Right 12/22/2016   Procedure: CYSTOSCOPY WITH RETROGRADE PYELOGRAM;  Surgeon: Hollice Espy, MD;  Location:  [facility] ORS;  Service: Urology;  Laterality: Right;  . CYSTOSCOPY W/ URETERAL STENT PLACEMENT Right 01/07/2017   Procedure: CYSTOSCOPY WITH STENT REPLACEMENT;  Surgeon: Hollice Espy, MD;  Location: [facility] ORS;  Service: Urology;  Laterality: Right;  . CYSTOSCOPY WITH STENT PLACEMENT Right 12/22/2016   Procedure: CYSTOSCOPY WITH STENT PLACEMENT;  Surgeon: Hollice Espy, MD;  Location: [facility] ORS;  Service: Urology;  Laterality: Right;  . URETEROSCOPY WITH HOLMIUM LASER LITHOTRIPSY Right 01/07/2017   Procedure: URETEROSCOPY WITH HOLMIUM LASER LITHOTRIPSY;  Surgeon: Hollice Espy, MD;  Location: [facility] ORS;  Service: Urology;  Laterality: Right;    Home Medications:  Allergies as of 02/13/2017      Reactions   Sulfa Antibiotics Rash   Could've been caused by sun exposure.   Vicodin [hydrocodone-acetaminophen] Rash      Medication List       Accurate as of 02/13/17 11:59 PM. Always use your most recent med list.          alendronate 70 MG tablet Commonly known as:  FOSAMAX Take 1 tablet by mouth once a week.   carvedilol 6.25 MG tablet Commonly known as:  COREG Take 1 tablet by mouth 2 (two) times daily.   losartan 50 MG tablet Commonly known as:  COZAAR Take 1 tablet by mouth every morning.       Allergies:  Allergies  Allergen Reactions  . Sulfa Antibiotics Rash    Could've been caused by sun exposure.  . Vicodin [Hydrocodone-Acetaminophen] Rash    Family History: Family History  Problem Relation  Age of Onset  . HIV Mother   . Brain cancer Father     Social History:  reports that she has quit smoking. She has quit using smokeless tobacco. She reports that she uses drugs, including Marijuana. She reports that she does not drink alcohol.  ROS: UROLOGY Frequent Urination?: No Hard to postpone urination?: No Burning/pain with urination?: No Get up at night to urinate?: No Leakage of urine?: Yes Urine stream starts and stops?: No Trouble starting stream?: No Do you  have to strain to urinate?: No Blood in urine?: No Urinary tract infection?: No Sexually transmitted disease?: No Injury to kidneys or bladder?: No Painful intercourse?: No Weak stream?: No Currently pregnant?: No Vaginal bleeding?: No Last menstrual period?: n  Gastrointestinal Nausea?: No Vomiting?: No Indigestion/heartburn?: No Diarrhea?: No Constipation?: No  Constitutional Fever: No Night sweats?: No Weight loss?: No Fatigue?: No  Skin Skin rash/lesions?: No Itching?: No  Eyes Blurred vision?: No Double vision?: No  Ears/Nose/Throat Sore throat?: No Sinus problems?: Yes  Hematologic/Lymphatic Swollen glands?: No Easy bruising?: No  Cardiovascular Leg swelling?: No Chest pain?: No  Respiratory Cough?: No Shortness of breath?: No  Endocrine Excessive thirst?: No  Musculoskeletal Back pain?: Yes Joint pain?: No  Neurological Headaches?: No Dizziness?: No  Psychologic Depression?: No Anxiety?: No  Physical Exam: BP (!) 140/92   Pulse 86   Ht 5\' 8"  (1.727 m)   Wt 159 lb (72.1 kg)   BMI 24.18 kg/m   Constitutional:  Alert and oriented, No acute distress. HEENT: [place] AT, moist mucus membranes.  Trachea midline, no masses. Cardiovascular: No clubbing, cyanosis, or edema. Respiratory: Normal respiratory effort, no increased work of breathing. GI: Abdomen is soft, nontender, nondistended, no abdominal masses GU: No CVA tenderness. Reproducible left flank pain with palpation over left posterior ntracostal muscles. Skin: No rashes, bruises or suspicious lesions. Lymph: No cervical or inguinal adenopathy. Neurologic: Grossly intact, no focal deficits, moving all 4 extremities. Psychiatric: Normal mood and affect.  Laboratory Data: Lab Results  Component Value Date   WBC 25.6 (H) 12/25/2016   HGB 12.0 12/25/2016   HCT 33.9 (L) 12/25/2016   MCV 93.9 12/25/2016   PLT 156 12/25/2016    Lab Results  Component Value Date   CREATININE 0.87  12/25/2016    Urinalysis N/a  Pertinent Imaging: CLINICAL DATA:  Status post right ureteral stent for renal calculi.  EXAM: RENAL / URINARY TRACT ULTRASOUND COMPLETE  COMPARISON:  CT of 12/22/2016  FINDINGS: Right Kidney:  Length: 10.0 cm. No hydronephrosis. The renal calculi described on prior CT are not readily apparent. An area of apparent hypoechogenicity in the upper pole right kidney measures 1.1 cm and is without correlate on prior CT. Favor a "Pseudotumor" when correlated with image 29 of the prior CT.  Left Kidney:  Length: 10.3 cm. No hydronephrosis. An interpolar left renal hyperechoic lesion measures 1.7 cm and corresponds to the angiomyolipoma on prior CT. An 8 mm cyst or minimally complex cyst is identified in the lower pole region.  The medial exophytic interpolar left renal lesion is not identified.  Bladder:  Within normal limits.  Bilateral ureteric jets identified.  IMPRESSION: 1. No residual right-sided hydronephrosis. 2. Left renal lesions, better evaluated on 12/22/2016 stone study.   Electronically Signed   By: Abigail Miyamoto M.D.   On: 02/06/2017 08:20  Renal ultrasound partially reviewed today.  Assessment & Plan:    1. History of nephrolithiasis Status post successful right ureteroscopy without residual hydronephrosis  Stenosis reviewed with the patient  We discussed general stone prevention techniques including drinking plenty water with goal of producing 2.5 L urine daily, increased citric acid intake, avoidance of high oxalate containing foods, and decreased salt intake.  Information about dietary recommendations given today.    - US Renal; Future  2. 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  Hollice Espy, MD  [facility] [address], [place] [address] [phone]  I spent 25 min with this patient of which greater than 50% was spent in counseling and coordination of care with the patient.

## 2017-06-12 ENCOUNTER — Ambulatory Visit: Payer: Self-pay | Admitting: Podiatry

## 2017-06-19 ENCOUNTER — Ambulatory Visit (INDEPENDENT_AMBULATORY_CARE_PROVIDER_SITE_OTHER): Payer: BLUE CROSS/BLUE SHIELD | Admitting: Podiatry

## 2017-06-19 DIAGNOSIS — L600 Ingrowing nail: Secondary | ICD-10-CM

## 2017-06-19 NOTE — Patient Instructions (Signed)

## 2017-06-22 NOTE — Progress Notes (Signed)
   Subjective: Patient presents today for evaluation of pain to the right great toe, right second toe and left great toe that began several years ago. Patient is concerned for possible ingrown nails. She reports h/o bilateral great toenail avulsions. Wearing certain shoes increase the pain. She has been soaking the feet in Epsom salt and has been trimming the nails down. Patient presents today for further treatment and evaluation.  Past Medical History:  Diagnosis Date  . Cardiomyopathy ([place])   . Hypertension   . Kidney stone   . PONV (postoperative nausea and vomiting)    HARD TO WAKE UP 20 YEARS AGO FOR KIDNEY STONE-NO PROBLEM WITH 12-22-16 SURGERY    Objective:  General: Well developed, nourished, in no acute distress, alert and oriented x3   Dermatology: Skin is warm, dry and supple bilateral. Medial borders of the right great toe, right 2nd toe and left great toe appears to be erythematous with evidence of an ingrowing nail. Pain on palpation noted to the border of the nail fold. The remaining nails appear unremarkable at this time. There are no open sores, lesions.  Vascular: Dorsalis Pedis artery and Posterior Tibial artery pedal pulses palpable. No lower extremity edema noted.   Neruologic: Grossly intact via light touch bilateral.  Musculoskeletal: Muscular strength within normal limits in all groups bilateral. Normal range of motion noted to all pedal and ankle joints.   Assesement: #1 Paronychia with ingrowing nail medial border of right great toe #2 Paronychia with ingrowing nail medial border of right 2nd toe #3 Paronychia with ingrowing nail left great toe  Plan of Care:  1. Patient evaluated.  2. Discussed treatment alternatives and plan of care. Explained nail avulsion procedure and post procedure course to patient. 3. Patient opted for permanent partial nail avulsion of the right great toe and right second toe.  4. Prior to procedure, local anesthesia infiltration  utilized using 3 ml of a 50:50 mixture of 2% plain lidocaine and 0.5% plain marcaine in a normal hallux block fashion and a betadine prep performed.  5. Partial permanent nail avulsion with chemical matrixectomy performed using [id number] applications of phenol followed by alcohol flush.  6. Light dressing applied. 7. Return to clinic in 2 weeks to perform nail avulsion to left great toe.   Edrick Kins, DPM [facility]  Dr. Edrick Kins, [address]                                        [place], [place] [address]                Office [phone]  Fax [phone]

## 2017-07-17 ENCOUNTER — Ambulatory Visit: Payer: BLUE CROSS/BLUE SHIELD | Admitting: Podiatry

## 2017-09-07 ENCOUNTER — Ambulatory Visit: Payer: BLUE CROSS/BLUE SHIELD | Admitting: Podiatry

## 2017-09-27 ENCOUNTER — Emergency Department
Admission: EM | Admit: 2017-09-27 | Discharge: 2017-09-27 | Disposition: A | Discharge status: Home / Self Care | Payer: BLUE CROSS/BLUE SHIELD | Attending: Emergency Medicine | Admitting: Emergency Medicine

## 2017-09-27 ENCOUNTER — Other Ambulatory Visit: Payer: Self-pay

## 2017-09-27 ENCOUNTER — Emergency Department: Payer: BLUE CROSS/BLUE SHIELD

## 2017-09-27 ENCOUNTER — Encounter: Payer: Self-pay | Admitting: Emergency Medicine

## 2017-09-27 DIAGNOSIS — Z87891 Personal history of nicotine dependence: Secondary | ICD-10-CM | POA: Diagnosis not present

## 2017-09-27 DIAGNOSIS — Y929 Unspecified place or not applicable: Secondary | ICD-10-CM | POA: Diagnosis not present

## 2017-09-27 DIAGNOSIS — Z79899 Other long term (current) drug therapy: Secondary | ICD-10-CM | POA: Diagnosis not present

## 2017-09-27 DIAGNOSIS — Y999 Unspecified external cause status: Secondary | ICD-10-CM | POA: Diagnosis not present

## 2017-09-27 DIAGNOSIS — S51811A Laceration without foreign body of right forearm, initial encounter: Secondary | ICD-10-CM | POA: Diagnosis not present

## 2017-09-27 DIAGNOSIS — Z23 Encounter for immunization: Secondary | ICD-10-CM | POA: Diagnosis not present

## 2017-09-27 DIAGNOSIS — I10 Essential (primary) hypertension: Secondary | ICD-10-CM | POA: Diagnosis not present

## 2017-09-27 DIAGNOSIS — W540XXA Bitten by dog, initial encounter: Secondary | ICD-10-CM | POA: Insufficient documentation

## 2017-09-27 DIAGNOSIS — Y939 Activity, unspecified: Secondary | ICD-10-CM | POA: Diagnosis not present

## 2017-09-27 DIAGNOSIS — S51851A Open bite of right forearm, initial encounter: Secondary | ICD-10-CM

## 2017-09-27 MED ORDER — LORAZEPAM 0.5 MG PO TABS
0.5000 mg | ORAL_TABLET | Freq: Once | ORAL | Status: AC
  Administered 2017-09-27: 0.5 mg via ORAL

## 2017-09-27 MED ORDER — TETANUS-DIPHTH-ACELL PERTUSSIS 5-2.5-18.5 LF-MCG/0.5 IM SUSP
0.5000 mL | Freq: Once | INTRAMUSCULAR | Status: AC
  Administered 2017-09-27: 0.5 mL via INTRAMUSCULAR
  Filled 2017-09-27: qty 0.5

## 2017-09-27 MED ORDER — LIDOCAINE HCL (PF) 1 % IJ SOLN
5.0000 mL | Freq: Once | INTRAMUSCULAR | Status: AC
  Administered 2017-09-27: 5 mL via INTRADERMAL
  Filled 2017-09-27: qty 5

## 2017-09-27 MED ORDER — AMOXICILLIN-POT CLAVULANATE 875-125 MG PO TABS: 1.0000 | ORAL_TABLET | Freq: Two times a day (BID) | ORAL | 0 refills | Status: DC

## 2017-09-27 MED ORDER — OXYCODONE-ACETAMINOPHEN 5-325 MG PO TABS: 1.0000 | ORAL_TABLET | Freq: Four times a day (QID) | ORAL | 0 refills | Status: DC | PRN

## 2017-09-27 MED ORDER — BACITRACIN ZINC 500 UNIT/GM EX OINT
1.0000 "application " | TOPICAL_OINTMENT | Freq: Once | CUTANEOUS | Status: AC
  Administered 2017-09-27: 1 via TOPICAL
  Filled 2017-09-27: qty 0.9

## 2017-09-27 MED ORDER — LORAZEPAM 0.5 MG PO TABS
ORAL_TABLET | ORAL | Status: AC
  Administered 2017-09-27: 0.5 mg via ORAL
  Filled 2017-09-27: qty 1

## 2017-09-27 NOTE — Discharge Instructions (Signed)
Do not get the sutured area wet for 24 hours. After 24 hours, shower/bathe as usual and pat the area dry. Change the bandage 2 times per day and apply antibiotic ointment. Leave open to air when at no risk of getting the area dirty, but cover at night before bed.   

## 2017-09-27 NOTE — ED Notes (Signed)
ED Provider at bedside. 

## 2017-09-27 NOTE — ED Notes (Signed)
Pt bitten by own dog. Pt has drsg in place from triage. Pt able to make fist and denies altered sensation.

## 2017-09-27 NOTE — ED Triage Notes (Signed)
Pt reports she tried to stop one of her dogs from biting her other dog and the dog bit her. Pt reports the dog is UTD on rabies vaccine. Pt presents with lacerations to right forearm. Bleeding controlled.

## 2017-09-27 NOTE — ED Provider Notes (Signed)
[facility] Emergency Department Provider Note  ____________________________________________  Time seen: Approximately 7:24 PM  I have reviewed the triage vital signs and the nursing notes.   HISTORY  Chief Complaint Animal Bite   HPI Kim Riley is a 56 y.o. female presents to the emergency department for evaluation of lacerations to the right forearm.  She states that she tried to stop 1 of her dogs from biting the other and it accidentally bit her instead.  The dog is up-to-date on his rabies vaccinations. She states that her little dog started a fight with her pit bull and she was protecting the little one. She can't remember when she had a Tdap last.   Past Medical History:  Diagnosis Date  . Cardiomyopathy ([place])   . Hypertension   . Kidney stone   . PONV (postoperative nausea and vomiting)    HARD TO WAKE UP 20 YEARS AGO FOR KIDNEY STONE-NO PROBLEM WITH 12-22-16 SURGERY    Patient Active Problem List   Diagnosis Date Noted  . Sepsis ([place]) 12/25/2016  . Right flank pain   . Ureterolithiasis   . Hydronephrosis 12/22/2016  . Cardiomyopathy ([place]) 07/19/2015  . Essential hypertension 07/19/2015  . Supraventricular tachycardia ([place]) 07/19/2015    Past Surgical History:  Procedure Laterality Date  . CYSTOSCOPY W/ RETROGRADES Right 12/22/2016   Procedure: CYSTOSCOPY WITH RETROGRADE PYELOGRAM;  Surgeon: Hollice Espy, MD;  Location: [facility] ORS;  Service: Urology;  Laterality: Right;  . CYSTOSCOPY W/ URETERAL STENT PLACEMENT Right 01/07/2017   Procedure: CYSTOSCOPY WITH STENT REPLACEMENT;  Surgeon: Hollice Espy, MD;  Location: [facility] ORS;  Service: Urology;  Laterality: Right;  . CYSTOSCOPY WITH STENT PLACEMENT Right 12/22/2016   Procedure: CYSTOSCOPY WITH STENT PLACEMENT;  Surgeon: Hollice Espy, MD;  Location: [facility] ORS;  Service: Urology;  Laterality: Right;  . URETEROSCOPY WITH HOLMIUM LASER LITHOTRIPSY Right 01/07/2017   Procedure: URETEROSCOPY  WITH HOLMIUM LASER LITHOTRIPSY;  Surgeon: Hollice Espy, MD;  Location: [facility] ORS;  Service: Urology;  Laterality: Right;    Prior to Admission medications   Medication Sig Start Date End Date Taking? Authorizing Provider  alendronate (FOSAMAX) 70 MG tablet Take 1 tablet by mouth once a week. 12/16/16   [provider]  amoxicillin-clavulanate (AUGMENTIN) 875-125 MG tablet Take 1 tablet by mouth 2 (two) times daily. 09/27/17   Erby Sanderson B, FNP  carvedilol (COREG) 6.25 MG tablet Take 6.25 mg by mouth. 05/28/17 08/26/17  [provider]  losartan (COZAAR) 50 MG tablet Take 50 mg by mouth. 05/28/17 08/26/17  [provider]  oxyCODONE-acetaminophen (ROXICET) 5-325 MG tablet Take 1 tablet by mouth every 6 (six) hours as needed. 09/27/17   Sherrie George B, FNP    Allergies Sulfa antibiotics and Vicodin [hydrocodone-acetaminophen]  Family History  Problem Relation Age of Onset  . HIV Mother   . Brain cancer Father     Social History Social History   Tobacco Use  . Smoking status: Former Research scientist (life sciences)  . Smokeless tobacco: Former Network engineer Use Topics  . Alcohol use: No  . Drug use: Yes    Types: Marijuana    Review of Systems  Constitutional: Negative for fever. Respiratory: Negative for cough or shortness of breath.  Musculoskeletal: Positive for myalgias Skin: Positive for laceration to the right forearm Neurological: Negative for numbness or paresthesias. ____________________________________________   PHYSICAL EXAM:  VITAL SIGNS: ED Triage Vitals  Enc Vitals Group     BP 09/27/17 1755 (!) 146/93  Pulse Rate 09/27/17 1755 83     Resp 09/27/17 1755 18     Temp 09/27/17 1755 98.3 F (36.8 C)     Temp Source 09/27/17 1755 Oral     SpO2 09/27/17 1755 100 %     Weight 09/27/17 1754 159 lb (72.1 kg)     Height 09/27/17 1754 5\' 8"  (1.727 m)     Head Circumference --      Peak Flow --      Pain Score 09/27/17 1754 0     Pain Loc --       Pain Edu? --      Excl. in [place]? --      Constitutional: Well appearing. Eyes: Conjunctivae are clear without discharge or drainage. Nose: No rhinorrhea noted. Mouth/Throat: Airway is patent.  Neck: No stridor. Unrestricted range of motion observed.  Cardiovascular: Capillary refill is <3 seconds.  Respiratory: Respirations are even and unlabored.. Musculoskeletal: Unrestricted range of motion observed. Neurologic: Awake, alert, and oriented x 4.  Skin:  Gaping lacerations to the right dorsal and volar aspect of the right forearm.  ____________________________________________   LABS (all labs ordered are listed, but only abnormal results are displayed)  Labs Reviewed - No data to display ____________________________________________  EKG  Not indicated. ____________________________________________  RADIOLOGY  Right forearm images are negative for acute bony abnormality. No radiopaque foreign bodies per radiology. ____________________________________________   PROCEDURES  .Marland KitchenLaceration Repair Date/Time: 09/27/2017 9:28 PM Performed by: Victorino Dike, FNP Authorized by: Victorino Dike, FNP   Consent:    Consent obtained:  Verbal   Consent given by:  Patient   Risks discussed:  Infection, pain and poor wound healing Anesthesia (see MAR for exact dosages):    Anesthesia method:  Local infiltration   Local anesthetic:  Lidocaine 1% w/o epi Laceration details:    Location:  Shoulder/arm   Shoulder/arm location:  R lower arm   Length (cm):  9 Repair type:    Repair type:  Simple Pre-procedure details:    Preparation:  Patient was prepped and draped in usual sterile fashion and imaging obtained to evaluate for foreign bodies Exploration:    Hemostasis achieved with:  Direct pressure   Contaminated: no   Treatment:    Area cleansed with:  Betadine   Amount of cleaning:  Extensive   Irrigation solution:  Sterile saline   Irrigation method:  Syringe Skin repair:     Repair method:  Sutures   Suture size:  4-0   Suture material:  Nylon   Suture technique:  Simple interrupted   Number of sutures:  5 Approximation:    Approximation:  Loose Post-procedure details:    Dressing:  Antibiotic ointment and bulky dressing   Patient tolerance of procedure:  Tolerated well, no immediate complications   ____________________________________________   INITIAL IMPRESSION / ASSESSMENT AND PLAN / ED COURSE  Kim Riley is a 56 y.o. female who presents to the emergency department for treatment and evaluation after a dog bite.  Patient was very anxious and became panicky during the procedure, but was given half milligram of Ativan p.o. which provided relief.  Patient was given strict return precautions and signs and symptoms of infection were reviewed. Sutures were required as the wounds were gaping and adipose tissue was exposed. She was given a prescription for Augmentin and Norco.  She was instructed to take all of the Augmentin until finished.  She is to have her primary care provider remove the sutures  in approximately 10 days.  Medications  bacitracin ointment 1 application (not administered)  Tdap (BOOSTRIX) injection 0.5 mL (0.5 mLs Intramuscular Given 09/27/17 2023)  lidocaine (PF) (XYLOCAINE) 1 % injection 5 mL (5 mLs Intradermal Given 09/27/17 2045)  lidocaine (PF) (XYLOCAINE) 1 % injection 5 mL (5 mLs Intradermal Given 09/27/17 2045)  LORazepam (ATIVAN) tablet 0.5 mg (0.5 mg Oral Given 09/27/17 2100)     Pertinent labs & imaging results that were available during my care of the patient were reviewed by me and considered in my medical decision making (see chart for details). ____________________________________________   FINAL CLINICAL IMPRESSION(S) / ED DIAGNOSES  Final diagnoses:  Dog bite of right forearm without complication, initial encounter  Laceration of right forearm, initial encounter    ED Discharge Orders        Ordered     amoxicillin-clavulanate (AUGMENTIN) 875-125 MG tablet  2 times daily     09/27/17 2122    oxyCODONE-acetaminophen (ROXICET) 5-325 MG tablet  Every 6 hours PRN     09/27/17 2122       Note:  This document was prepared using Dragon voice recognition software and may include unintentional dictation errors.    Victorino Dike, FNP 09/27/17 2136    Rudene Re, MD 09/28/17 [phone]

## 2017-10-22 ENCOUNTER — Encounter: Payer: Self-pay | Admitting: Podiatry

## 2017-10-22 ENCOUNTER — Ambulatory Visit (INDEPENDENT_AMBULATORY_CARE_PROVIDER_SITE_OTHER): Payer: BLUE CROSS/BLUE SHIELD | Admitting: Podiatry

## 2017-10-22 DIAGNOSIS — L600 Ingrowing nail: Secondary | ICD-10-CM

## 2017-10-22 NOTE — Progress Notes (Signed)
This patient  presents to the office with chief complaint pain along the inside border of the left big toe.  She says she has pain and discomfort walking and wearing her shoes.  She has a history of previous surgery in this office by Dr. Amalia Hailey.  She presents the office requesting the same surgery for the correction of her ingrowing toenail left big toe.    General Appearance  Alert, conversant and in no acute stress.  Vascular  Dorsalis pedis and posterior pulses are palpable  bilaterally.  Capillary return is within normal limits  bilaterally. Temperature is within normal limits  Bilaterally.  Neurologic  Senn-Weinstein monofilament wire test within normal limits  bilaterally. Muscle power within normal limits bilaterally.  Nails marked incurvation noted along the medial border of the left great toenail.  This toenail is thick disfigured and incurvated along the medial border.  Orthopedic  No limitations of motion of motion feet bilaterally.  No crepitus or effusions noted.  No bony pathology or digital deformities noted.  Skin  normotropic skin with no porokeratosis noted bilaterally.  No signs of infections or ulcers noted.  Ingrown Toenail medial border left great toenail.  IE  Nail surgery.  Treatment options and alternatives discussed.  Recommended permanent phenol matrixectomy and patient agreed.  Left hallux  was prepped with alcohol and a toe block of 3cc of 2% lidocaine plain was administered in a digital toe block. .  The toe was then prepped with betadine solution .  The offending nail border was then excised and matrix tissue exposed.  Phenol was then applied to the matrix tissue followed by an alcohol wash.  Antibiotic ointment and a dry sterile dressing was applied.  The patient was dispensed instructions for aftercare. RTC prn.     Gardiner Barefoot DPM

## 2017-10-23 ENCOUNTER — Ambulatory Visit: Payer: BLUE CROSS/BLUE SHIELD | Admitting: Podiatry

## 2017-12-31 ENCOUNTER — Ambulatory Visit: Payer: Self-pay

## 2017-12-31 ENCOUNTER — Encounter: Payer: Self-pay | Admitting: Podiatry

## 2017-12-31 ENCOUNTER — Ambulatory Visit (INDEPENDENT_AMBULATORY_CARE_PROVIDER_SITE_OTHER): Payer: BLUE CROSS/BLUE SHIELD | Admitting: Podiatry

## 2017-12-31 VITALS — BP 121/78 | HR 63

## 2017-12-31 DIAGNOSIS — B351 Tinea unguium: Secondary | ICD-10-CM | POA: Diagnosis not present

## 2017-12-31 DIAGNOSIS — M79674 Pain in right toe(s): Secondary | ICD-10-CM

## 2017-12-31 DIAGNOSIS — R52 Pain, unspecified: Secondary | ICD-10-CM | POA: Diagnosis not present

## 2017-12-31 DIAGNOSIS — L600 Ingrowing nail: Secondary | ICD-10-CM | POA: Diagnosis not present

## 2017-12-31 NOTE — Progress Notes (Signed)
This patient  presents to the office with chief complaint pain along the outside of the right big toe..  She says she has pain and discomfort walking and wearing her shoes.  She has a history of previous surgery in this office by Dr. Amalia Hailey and myself.  She presents the office requesting the same surgery for the correction of her ingrowing toenail right  big toe.    General Appearance  Alert, conversant and in no acute stress.  Vascular  Dorsalis pedis and posterior pulses are palpable  bilaterally.  Capillary return is within normal limits  bilaterally. Temperature is within normal limits  Bilaterally.  Neurologic  Senn-Weinstein monofilament wire test within normal limits  bilaterally. Muscle power within normal limits bilaterally.  Nails marked incurvation noted along the lateral border right great toe.  This toenail is thick disfigured and incurvated along the lateral border.  Orthopedic  No limitations of motion of motion feet bilaterally.  No crepitus or effusions noted.  No bony pathology or digital deformities noted.  Skin  normotropic skin with no porokeratosis noted bilaterally.  No signs of infections or ulcers noted.  Ingrown Toenail lateral border right great toe.  IE  Nail surgery.  Treatment options and alternatives discussed.  Recommended permanent phenol matrixectomy and patient agreed.  Right  hallux  was prepped with alcohol and a toe block of 3cc of 2% lidocaine plain was administered in a digital toe block. .  The toe was then prepped with betadine solution .  The offending nail border was then excised and matrix tissue exposed.  Phenol was then applied to the matrix tissue followed by an alcohol wash.  Antibiotic ointment and a dry sterile dressing was applied.  The patient was dispensed instructions for aftercare. RTC prn.     Gardiner Barefoot DPM

## 2017-12-31 NOTE — Patient Instructions (Signed)

## 2018-02-12 ENCOUNTER — Ambulatory Visit
Admission: RE | Admit: 2018-02-12 | Discharge: 2018-02-12 | Disposition: A | Discharge status: Home / Self Care | Payer: BLUE CROSS/BLUE SHIELD | Source: Ambulatory Visit | Attending: Urology | Admitting: Urology

## 2018-02-12 DIAGNOSIS — N2889 Other specified disorders of kidney and ureter: Secondary | ICD-10-CM | POA: Diagnosis not present

## 2018-02-12 DIAGNOSIS — D1771 Benign lipomatous neoplasm of kidney: Secondary | ICD-10-CM | POA: Insufficient documentation

## 2018-02-12 DIAGNOSIS — Z87442 Personal history of urinary calculi: Secondary | ICD-10-CM | POA: Insufficient documentation

## 2018-02-12 DIAGNOSIS — N281 Cyst of kidney, acquired: Secondary | ICD-10-CM | POA: Diagnosis not present

## 2018-02-19 ENCOUNTER — Ambulatory Visit: Payer: BLUE CROSS/BLUE SHIELD | Admitting: Urology

## 2018-04-29 ENCOUNTER — Ambulatory Visit: Payer: BLUE CROSS/BLUE SHIELD | Admitting: Podiatry

## 2018-06-18 ENCOUNTER — Encounter: Payer: Self-pay | Admitting: Emergency Medicine

## 2018-06-18 ENCOUNTER — Other Ambulatory Visit: Payer: Self-pay

## 2018-06-18 DIAGNOSIS — I10 Essential (primary) hypertension: Secondary | ICD-10-CM | POA: Diagnosis not present

## 2018-06-18 DIAGNOSIS — K859 Acute pancreatitis without necrosis or infection, unspecified: Secondary | ICD-10-CM | POA: Diagnosis not present

## 2018-06-18 DIAGNOSIS — R1010 Upper abdominal pain, unspecified: Secondary | ICD-10-CM | POA: Diagnosis present

## 2018-06-18 DIAGNOSIS — Q6102 Congenital multiple renal cysts: Secondary | ICD-10-CM | POA: Insufficient documentation

## 2018-06-18 DIAGNOSIS — Z87891 Personal history of nicotine dependence: Secondary | ICD-10-CM | POA: Insufficient documentation

## 2018-06-18 DIAGNOSIS — Z79899 Other long term (current) drug therapy: Secondary | ICD-10-CM | POA: Insufficient documentation

## 2018-06-18 NOTE — ED Triage Notes (Signed)
Pt arrives POV to triage with c/o left upper abdominal pain since yesterday which has been increasing in intensity since that time. Pt is tearful at this time in triage and reports that the pain radiates around to her back.

## 2018-06-19 ENCOUNTER — Emergency Department: Payer: BLUE CROSS/BLUE SHIELD

## 2018-06-19 ENCOUNTER — Emergency Department
Admission: EM | Admit: 2018-06-19 | Discharge: 2018-06-19 | Disposition: A | Discharge status: Home / Self Care | Payer: BLUE CROSS/BLUE SHIELD | Attending: Emergency Medicine | Admitting: Emergency Medicine

## 2018-06-19 DIAGNOSIS — R1010 Upper abdominal pain, unspecified: Secondary | ICD-10-CM

## 2018-06-19 DIAGNOSIS — K859 Acute pancreatitis without necrosis or infection, unspecified: Secondary | ICD-10-CM

## 2018-06-19 DIAGNOSIS — Q6102 Congenital multiple renal cysts: Secondary | ICD-10-CM

## 2018-06-19 LAB — TRIGLYCERIDES: TRIGLYCERIDES: 85 mg/dL (ref <150)

## 2018-06-19 LAB — COMPREHENSIVE METABOLIC PANEL
ALBUMIN: 4.3 g/dL (ref 3.5–5.0)
ALT: 18 U/L (ref 0–44)
ANION GAP: 10 (ref 5–15)
AST: 21 U/L (ref 15–41)
Alkaline Phosphatase: 49 U/L (ref 38–126)
BUN: 17 mg/dL (ref 6–20)
CHLORIDE: 102 mmol/L (ref 98–111)
CO2: 26 mmol/L (ref 22–32)
Calcium: 9.3 mg/dL (ref 8.9–10.3)
Creatinine, Ser: 0.89 mg/dL (ref 0.44–1.00)
GFR calc Af Amer: >60 mL/min (ref >60)
GFR calc non Af Amer: >60 mL/min (ref >60)
GLUCOSE: 119 mg/dL — AB (ref 70–99)
Potassium: 3.4 mmol/L — ABNORMAL LOW (ref 3.5–5.1)
SODIUM: 138 mmol/L (ref 135–145)
Total Bilirubin: 0.5 mg/dL (ref 0.3–1.2)
Total Protein: 7.3 g/dL (ref 6.5–8.1)

## 2018-06-19 LAB — URINALYSIS, COMPLETE (UACMP) WITH MICROSCOPIC
BILIRUBIN URINE: NEGATIVE
Glucose, UA: NEGATIVE mg/dL
Ketones, ur: NEGATIVE mg/dL
Leukocytes, UA: NEGATIVE
NITRITE: NEGATIVE
Protein, ur: NEGATIVE mg/dL
SPECIFIC GRAVITY, URINE: 1.004 — AB (ref 1.005–1.030)
pH: 6 (ref 5.0–8.0)

## 2018-06-19 LAB — CBC
HEMATOCRIT: 37.8 % (ref 35.0–47.0)
HEMOGLOBIN: 13.2 g/dL (ref 12.0–16.0)
MCH: 33.4 pg (ref 26.0–34.0)
MCHC: 34.9 g/dL (ref 32.0–36.0)
MCV: 95.5 fL (ref 80.0–100.0)
Platelets: 239 10*3/uL (ref 150–440)
RBC: 3.96 MIL/uL (ref 3.80–5.20)
RDW: 12.3 % (ref 11.5–14.5)
WBC: 10.5 10*3/uL (ref 3.6–11.0)

## 2018-06-19 LAB — LIPASE, BLOOD: LIPASE: 119 U/L — AB (ref 11–51)

## 2018-06-19 MED ORDER — ONDANSETRON HCL 4 MG/2ML IJ SOLN
4.0000 mg | Freq: Once | INTRAMUSCULAR | Status: AC
  Administered 2018-06-19: 4 mg via INTRAVENOUS
  Filled 2018-06-19: qty 2

## 2018-06-19 MED ORDER — OXYCODONE-ACETAMINOPHEN 5-325 MG PO TABS
ORAL_TABLET | ORAL | Status: AC
  Filled 2018-06-19: qty 1

## 2018-06-19 MED ORDER — IOPAMIDOL (ISOVUE-300) INJECTION 61%
100.0000 mL | Freq: Once | INTRAVENOUS | Status: AC | PRN
  Administered 2018-06-19: 100 mL via INTRAVENOUS

## 2018-06-19 MED ORDER — ONDANSETRON HCL 4 MG PO TABS: 4.0000 mg | ORAL_TABLET | Freq: Every day | ORAL | 0 refills | Status: DC | PRN

## 2018-06-19 MED ORDER — SODIUM CHLORIDE 0.9 % IV BOLUS
1000.0000 mL | Freq: Once | INTRAVENOUS | Status: AC
  Administered 2018-06-19: 1000 mL via INTRAVENOUS

## 2018-06-19 MED ORDER — OXYCODONE-ACETAMINOPHEN 5-325 MG PO TABS
1.0000 | ORAL_TABLET | ORAL | Status: DC | PRN
  Administered 2018-06-19: 1 via ORAL

## 2018-06-19 MED ORDER — OXYCODONE-ACETAMINOPHEN 2.5-325 MG PO TABS: 1.0000 | ORAL_TABLET | ORAL | 0 refills | Status: DC | PRN

## 2018-06-19 MED ORDER — MORPHINE SULFATE (PF) 4 MG/ML IV SOLN
4.0000 mg | Freq: Once | INTRAVENOUS | Status: AC
  Administered 2018-06-19: 4 mg via INTRAVENOUS
  Filled 2018-06-19: qty 1

## 2018-06-19 NOTE — ED Notes (Signed)
Pt updated on wait time.  

## 2018-06-19 NOTE — ED Provider Notes (Signed)
[facility] Emergency Department Provider Note  ___________________________________________   First MD Initiated Contact with Patient 06/19/18 [phone]     (approximate)  I have reviewed the triage vital signs and the nursing notes.   HISTORY  Chief Complaint Abdominal Pain   HPI Kim Riley is a 57 y.o. female with a history of cardiomyopathy, hypertension and kidney stones was presented to the emergency department today with left flank pain as well as epigastric pain radiating through to her back.  She states the pain is been ongoing for several days now and she thought originally that it was a muscle pull.  However, she states that the pain is constant not worse with movement.  Says that she has mild nausea.  Denies any chest pain or shortness of breath.  No diarrhea.  Denies any urinary complaints.  Says that this feels quite different from her previous kidney stones.  No blood in her urine that she is noticed.  States that she does not drink.  Still has her gallbladder.  States that the pain feels like a squeezing pain.   Past Medical History:  Diagnosis Date  . Cardiomyopathy ([place])   . Hypertension   . Kidney stone   . PONV (postoperative nausea and vomiting)    HARD TO WAKE UP 20 YEARS AGO FOR KIDNEY STONE-NO PROBLEM WITH 12-22-16 SURGERY    Patient Active Problem List   Diagnosis Date Noted  . Sepsis ([place]) 12/25/2016  . Right flank pain   . Ureterolithiasis   . Hydronephrosis 12/22/2016  . Cardiomyopathy ([place]) 07/19/2015  . Essential hypertension 07/19/2015  . Supraventricular tachycardia ([place]) 07/19/2015    Past Surgical History:  Procedure Laterality Date  . CYSTOSCOPY W/ RETROGRADES Right 12/22/2016   Procedure: CYSTOSCOPY WITH RETROGRADE PYELOGRAM;  Surgeon: Hollice Espy, MD;  Location: [facility] ORS;  Service: Urology;  Laterality: Right;  . CYSTOSCOPY W/ URETERAL STENT PLACEMENT Right 01/07/2017   Procedure: CYSTOSCOPY WITH STENT  REPLACEMENT;  Surgeon: Hollice Espy, MD;  Location: [facility] ORS;  Service: Urology;  Laterality: Right;  . CYSTOSCOPY WITH STENT PLACEMENT Right 12/22/2016   Procedure: CYSTOSCOPY WITH STENT PLACEMENT;  Surgeon: Hollice Espy, MD;  Location: [facility] ORS;  Service: Urology;  Laterality: Right;  . URETEROSCOPY WITH HOLMIUM LASER LITHOTRIPSY Right 01/07/2017   Procedure: URETEROSCOPY WITH HOLMIUM LASER LITHOTRIPSY;  Surgeon: Hollice Espy, MD;  Location: [facility] ORS;  Service: Urology;  Laterality: Right;    Prior to Admission medications   Medication Sig Start Date End Date Taking? Authorizing Provider  alendronate (FOSAMAX) 70 MG tablet Take 1 tablet by mouth once a week. 12/16/16   [provider]  amoxicillin-clavulanate (AUGMENTIN) 875-125 MG tablet Take 1 tablet by mouth 2 (two) times daily. Patient not taking: Reported on 12/31/2017 09/27/17   Sherrie George B, FNP  carvedilol (COREG) 6.25 MG tablet Take 6.25 mg by mouth. 05/28/17 08/26/17  [provider]  losartan (COZAAR) 50 MG tablet Take 50 mg by mouth. 05/28/17 08/26/17  [provider]  oxyCODONE-acetaminophen (ROXICET) 5-325 MG tablet Take 1 tablet by mouth every 6 (six) hours as needed. Patient not taking: Reported on 12/31/2017 09/27/17   Victorino Dike, FNP    Allergies Sulfa antibiotics and Vicodin [hydrocodone-acetaminophen]  Family History  Problem Relation Age of Onset  . HIV Mother   . Brain cancer Father     Social History Social History   Tobacco Use  . Smoking status: Former Research scientist (life sciences)  . Smokeless tobacco: Former Network engineer  Use Topics  . Alcohol use: No  . Drug use: Yes    Types: Marijuana    Review of Systems  Constitutional: No fever/chills Eyes: No visual changes. ENT: No sore throat. Cardiovascular: Denies chest pain. Respiratory: Denies shortness of breath. Gastrointestinal: no vomiting.  No diarrhea.  No constipation. Genitourinary: Negative for dysuria. Musculoskeletal: As  above Skin: Negative for rash. Neurological: Negative for headaches, focal weakness or numbness.   ____________________________________________   PHYSICAL EXAM:  VITAL SIGNS: ED Triage Vitals  Enc Vitals Group     BP 06/18/18 2341 (!) 137/98     Pulse Rate 06/18/18 2341 68     Resp 06/18/18 2341 18     Temp --      Temp src --      SpO2 06/18/18 2341 100 %     Weight 06/18/18 2339 160 lb (72.6 kg)     Height 06/18/18 2339 5\' 8"  (1.727 m)     Head Circumference --      Peak Flow --      Pain Score 06/18/18 2339 10     Pain Loc --      Pain Edu? --      Excl. in [place]? --     Constitutional: Alert and oriented. Well appearing and in no acute distress. Eyes: Conjunctivae are normal.  Head: Atraumatic. Nose: No congestion/rhinnorhea. Mouth/Throat: Mucous membranes are moist.  Neck: No stridor.   Cardiovascular: Normal rate, regular rhythm. Grossly normal heart sounds.   Respiratory: Normal respiratory effort.  No retractions. Lungs CTAB. Gastrointestinal: Soft with moderate epigastric tenderness to palpation.  Mild left upper quadrant tenderness to palpation. No distention.  Mild left CVA tenderness. Musculoskeletal: No lower extremity tenderness nor edema.  No joint effusions. Neurologic:  Normal speech and language. No gross focal neurologic deficits are appreciated. Skin:  Skin is warm, dry and intact. No rash noted. Psychiatric: Mood and affect are normal. Speech and behavior are normal.  ____________________________________________   LABS (all labs ordered are listed, but only abnormal results are displayed)  Labs Reviewed  LIPASE, BLOOD - Abnormal; Notable for the following components:      Result Value   Lipase 119 (*)    All other components within normal limits  COMPREHENSIVE METABOLIC PANEL - Abnormal; Notable for the following components:   Potassium 3.4 (*)    Glucose, Bld 119 (*)    All other components within normal limits  URINALYSIS, COMPLETE (UACMP)  WITH MICROSCOPIC - Abnormal; Notable for the following components:   Color, Urine COLORLESS (*)    APPearance CLEAR (*)    Specific Gravity, Urine 1.004 (*)    Hgb urine dipstick SMALL (*)    Bacteria, UA RARE (*)    All other components within normal limits  CBC  TRIGLYCERIDES   ____________________________________________  EKG  ED ECG REPORT I, Doran Stabler, the attending physician, personally viewed and interpreted this ECG.   Date: 06/19/2018  EKG Time: 2353  Rate: 65  Rhythm: normal sinus rhythm  Axis: Normal  Intervals:none  ST&T Change: No ST segment elevation or depression.  No abnormal T wave inversion.  ____________________________________________  RADIOLOGY  Mild left hydronephrosis.  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  No acute internal abdominal pathology on the CAT scan.  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 ____________________________________________   PROCEDURES  Procedure(s) performed:   Procedures  Critical Care performed:   ____________________________________________   INITIAL IMPRESSION / ASSESSMENT AND  PLAN / ED COURSE  Pertinent labs & imaging results that were available during my care of the patient were reviewed by me and considered in my medical decision making (see chart for details).  Differential diagnosis includes, but is not limited to, biliary disease (biliary colic, acute cholecystitis, cholangitis, choledocholithiasis, etc), intrathoracic causes for epigastric abdominal pain including ACS, gastritis, duodenitis, pancreatitis, small bowel or large bowel obstruction, abdominal aortic aneurysm, hernia, and ulcer(s). As part of my medical decision making, I reviewed the following data within the electronic MEDICAL RECORD NUMBER Notes from prior ED visits  ----------------------------------------- 5:49 AM on 06/19/2018 -----------------------------------------  Patient states  her pain is now 3-4 and she has been able to take sips of water without vomiting or increased pain.  Unclear cause of the patient's pancreatitis.  We reviewed her imaging.  The patient says that she would like to go home and knows that she must return for any worsening concerning symptoms.  Will be discharged with Percocet as well as Zofran.  Patient knows that she must not drive or go to work using the [employer].  She was follow-up with her primary care doctor, urology as well as GI for her pathology uncovered today.  She is understanding of the diagnosis as well as treatment and willing to comply. ____________________________________________   FINAL CLINICAL IMPRESSION(S) / ED DIAGNOSES  Renal cysts.  Abdominal pain.  Pancreatitis.  NEW MEDICATIONS STARTED DURING THIS VISIT:  New Prescriptions   No medications on file     Note:  This document was prepared using Dragon voice recognition software and may include unintentional dictation errors.     Orbie Pyo, MD 06/19/18 [phone]

## 2018-06-22 ENCOUNTER — Encounter: Payer: Self-pay | Admitting: Urology

## 2018-06-22 ENCOUNTER — Ambulatory Visit: Payer: BLUE CROSS/BLUE SHIELD | Admitting: Urology

## 2018-06-22 VITALS — BP 108/72 | HR 91 | Ht 68.0 in | Wt 157.5 lb

## 2018-06-22 DIAGNOSIS — R1012 Left upper quadrant pain: Secondary | ICD-10-CM

## 2018-06-22 MED ORDER — OXYCODONE-ACETAMINOPHEN 2.5-325 MG PO TABS: 1.0000 | ORAL_TABLET | ORAL | 0 refills | Status: DC | PRN

## 2018-06-22 NOTE — Progress Notes (Signed)
06/22/2018 5:55 PM   Charlise May Eidson 12/01/1960 [id number]  Referring provider: Remi Haggard, [place], [place] [address]  Chief Complaint  Patient presents with  . Abdominal Pain    HPI: 57 year old female with a history of recurrent stone disease presents for evaluation of abdominal pain.  She was last seen in March 2018 for an obstructing right distal ureteral calculus with sepsis.  She initially underwent urgent stent placement and had definitive ureteroscopic treatment in April 2018.  She last saw Dr. Erlene Quan in May 2018 and was doing well.  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  On 9/19 she had onset of left epigastric pain to the left costal margin.  She thought she initially pulled a muscle however over the ensuing 24 hours her pain increased and radiated straight through to the left upper back.  Her pain was described as severe and she was seen in the ED.  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  There was no hydronephrosis noted.  Her lipase was elevated at 119.  No significant pancreatic abnormalities were noted on CT.  Her pain had improved in the ED and she was discharged on Percocet and Zofran with instructions to follow-up with her PCP, GI and urology.  Since her ED visit she rates her pain severity at 4-5.  There are no identifiable precipitating, aggravating or alleviating factors.  She had nausea in the ED but this has resolved.  She denies lower urinary tract symptoms or gross hematuria.  Her pain is not typical of prior episodes of renal colic.   PMH: Past Medical History:  Diagnosis Date  . Cardiomyopathy ([place])   . Hypertension   . Kidney stone   . PONV (postoperative nausea and vomiting)    HARD TO WAKE UP 20 YEARS AGO FOR KIDNEY STONE-NO PROBLEM WITH 12-22-16 SURGERY    Surgical  History: Past Surgical History:  Procedure Laterality Date  . CYSTOSCOPY W/ RETROGRADES Right 12/22/2016   Procedure: CYSTOSCOPY WITH RETROGRADE PYELOGRAM;  Surgeon: Hollice Espy, MD;  Location: [facility] ORS;  Service: Urology;  Laterality: Right;  . CYSTOSCOPY W/ URETERAL STENT PLACEMENT Right 01/07/2017   Procedure: CYSTOSCOPY WITH STENT REPLACEMENT;  Surgeon: Hollice Espy, MD;  Location: [facility];  Service: Urology;  Laterality: Right;  . CYSTOSCOPY WITH STENT PLACEMENT Right 12/22/2016   Procedure: CYSTOSCOPY WITH STENT PLACEMENT;  Surgeon: Hollice Espy, MD;  Location: [facility] OR[facility];  Service: Urology;  Laterality: Right;  . URETEROSCOPY WITH HOLMIUM LASER LITHOTRIPSY Right 01/07/2017   Procedure: URETEROSCOPY WITH HOLMIUM LASER LITHOTRIPSY;  Surgeon: Hollice Espy, MD;  Location: [facility] OR[facility];  Service: Urology;  Laterality: Right;    Home Medications:  Allergies as of 06/22/2018      Reactions   Sulfa Antibiotics Rash, Other (See Comments)   Could've been caused by sun exposure.   Vicodin [hydrocodone-acetaminophen] Rash      Medication List        Accurate as of 06/22/18  5:55 PM. Always use your most recent med list.          alendronate 70 MG tablet Commonly known as:  FOSAMAX Take 1 tablet by mouth once a week.   carvedilol 6.25 MG tablet Commonly known as:  COREG Take 6.25 mg by mouth.   losartan 50 MG tablet Commonly known as:  COZAAR Take  50 mg by mouth.   ondansetron 4 MG tablet Commonly known as:  ZOFRAN Take 1 tablet (4 mg total) by mouth daily as needed.   oxycodone-acetaminophen 2.5-325 MG tablet Commonly known as:  PERCOCET Take 1 tablet by mouth every 4 (four) hours as needed for pain.       Allergies:  Allergies  Allergen Reactions  . Sulfa Antibiotics Rash and Other (See Comments)    Could've been caused by sun exposure.  . Vicodin [Hydrocodone-Acetaminophen] Rash    Family History: Family History  Problem Relation Age of Onset  . HIV Mother     . Brain cancer Father     Social History:  reports that she has quit smoking. She has quit using smokeless tobacco. She reports that she has current or past drug history. Drug: Marijuana. She reports that she does not drink alcohol.  ROS: UROLOGY Frequent Urination?: No Hard to postpone urination?: No Burning/pain with urination?: No Get up at night to urinate?: No Leakage of urine?: No Urine stream starts and stops?: No Trouble starting stream?: No Do you have to strain to urinate?: No Blood in urine?: No Urinary tract infection?: No Sexually transmitted disease?: No Injury to kidneys or bladder?: No Painful intercourse?: No Weak stream?: No Currently pregnant?: No Vaginal bleeding?: No Last menstrual period?: Postmenopausal  Gastrointestinal Nausea?: No Vomiting?: No Indigestion/heartburn?: No Diarrhea?: No Constipation?: No  Constitutional Fever: No Night sweats?: No Weight loss?: No Fatigue?: No  Skin Skin rash/lesions?: No Itching?: No  Eyes Blurred vision?: No Double vision?: No  Ears/Nose/Throat Sore throat?: No Sinus problems?: No  Hematologic/Lymphatic Swollen glands?: No Easy bruising?: No  Cardiovascular Leg swelling?: No Chest pain?: No  Respiratory Cough?: No Shortness of breath?: No  Endocrine Excessive thirst?: No  Musculoskeletal Back pain?: No Joint pain?: No  Neurological Headaches?: No Dizziness?: No  Psychologic Depression?: No Anxiety?: No  Physical Exam: BP 108/72 (BP Location: Left Arm, Patient Position: Sitting, Cuff Size: Normal)   Pulse 91   Ht 5\' 8"  (1.727 m)   Wt 157 lb 8 oz (71.4 kg)   BMI 23.95 kg/m   Constitutional:  Alert and oriented, No acute distress. HEENT: [place] AT, moist mucus membranes.  Trachea midline, no masses. Cardiovascular: No clubbing, cyanosis, or edema. Respiratory: Normal respiratory effort, no increased work of breathing. GI: Abdomen is soft, mild left upper quadrant tenderness,  nondistended, no abdominal masses GU: No CVA tenderness Lymph: No cervical or inguinal lymphadenopathy. Skin: No rashes, bruises or suspicious lesions. Neurologic: Grossly intact, no focal deficits, moving all 4 extremities. Psychiatric: Normal mood and affect.  Pertinent Imaging: CT of 06/19/2018 was reviewed   Assessment & Plan:   57 year old female with left upper quadrant/epigastric and left upper back pain.  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  There are no findings to suggest her pain is GU in etiology.  She did have an elevated lipase.  Her lipase was repeated today.  I recommended she follow-up with her PCP for further evaluation of her pain.  She had one Percocet left and was given a limited Rx until she can follow-up with her PCP.   Abbie Sons, [place] [address] [place], [place] [address] [phone]

## 2018-06-23 ENCOUNTER — Telehealth: Payer: Self-pay

## 2018-06-23 LAB — LIPASE: LIPASE: 18 U/L (ref 14–72)

## 2018-06-23 NOTE — Telephone Encounter (Signed)
Left detailed message on pt vm.  

## 2018-06-23 NOTE — Telephone Encounter (Signed)
-----   Message from Abbie Sons, MD sent at 06/23/2018  7:21 AM EDT ----- Repeat lipase was normal at 18.  As we discussed yesterday her CT was not consistent with a urologic cause of her pain.  Recommend she follow-up with her PCP for further evaluation of her pain.

## 2018-06-28 ENCOUNTER — Ambulatory Visit: Payer: BLUE CROSS/BLUE SHIELD | Admitting: Podiatry

## 2018-07-05 ENCOUNTER — Encounter: Payer: Self-pay | Admitting: Gastroenterology

## 2018-07-22 ENCOUNTER — Ambulatory Visit: Payer: BLUE CROSS/BLUE SHIELD | Admitting: Podiatry

## 2018-08-16 ENCOUNTER — Ambulatory Visit: Payer: BLUE CROSS/BLUE SHIELD | Admitting: Gastroenterology

## 2018-12-02 ENCOUNTER — Encounter: Payer: Self-pay | Admitting: Family Medicine

## 2018-12-02 ENCOUNTER — Ambulatory Visit (INDEPENDENT_AMBULATORY_CARE_PROVIDER_SITE_OTHER): Payer: BLUE CROSS/BLUE SHIELD | Admitting: Family Medicine

## 2018-12-02 VITALS — BP 120/80 | HR 66 | Temp 97.9°F | Resp 16 | Ht 68.0 in | Wt 168.0 lb

## 2018-12-02 DIAGNOSIS — H9313 Tinnitus, bilateral: Secondary | ICD-10-CM | POA: Diagnosis not present

## 2018-12-02 DIAGNOSIS — I471 Supraventricular tachycardia: Secondary | ICD-10-CM | POA: Diagnosis not present

## 2018-12-02 DIAGNOSIS — Z1211 Encounter for screening for malignant neoplasm of colon: Secondary | ICD-10-CM

## 2018-12-02 DIAGNOSIS — B3324 Viral cardiomyopathy: Secondary | ICD-10-CM | POA: Diagnosis not present

## 2018-12-02 DIAGNOSIS — Z1231 Encounter for screening mammogram for malignant neoplasm of breast: Secondary | ICD-10-CM

## 2018-12-02 DIAGNOSIS — I10 Essential (primary) hypertension: Secondary | ICD-10-CM

## 2018-12-02 DIAGNOSIS — G930 Cerebral cysts: Secondary | ICD-10-CM | POA: Insufficient documentation

## 2018-12-02 DIAGNOSIS — N63 Unspecified lump in unspecified breast: Secondary | ICD-10-CM | POA: Insufficient documentation

## 2018-12-02 NOTE — Assessment & Plan Note (Signed)
I don't have information about this; diagnosed in [place], but has apparently grown slowly over the years; she does not think related to tinnitus; refer to specialist to monitor

## 2018-12-02 NOTE — Assessment & Plan Note (Signed)
Patient requests seeing a new cardiologist; referral entered

## 2018-12-02 NOTE — Assessment & Plan Note (Signed)
Reviewed [facility] report; not appropriate for screening mammogram; will get diag mammo at [facility]

## 2018-12-02 NOTE — Assessment & Plan Note (Signed)
Controlled today 

## 2018-12-02 NOTE — Patient Instructions (Signed)
We'll get diagnostic mammograms schedule for you We'll have you see the [facility] and Neurologist If you have not heard anything from my staff in a week about any orders/referrals/studies from today, please contact us here to follow-up [phone]

## 2018-12-02 NOTE — Assessment & Plan Note (Signed)
Refer back to cardiologist; it sounds like she has not had an echo in quite some time; evidence of pitting edema in the legs

## 2018-12-02 NOTE — Progress Notes (Signed)
BP 120/80 (BP Location: Left Arm, Patient Position: Sitting, Cuff Size: Normal)   Pulse 66   Temp 97.9 F (36.6 C) (Oral)   Resp 16   Ht 5\' 8"  (1.727 m)   Wt 168 lb (76.2 kg)   SpO2 99%   BMI 25.54 kg/m    Subjective:    Patient ID: Kim Riley, female    DOB: 08-Aug-1961, 58 y.o.   MRN: [id number]  HPI: Kim Riley is a 58 y.o. female  Chief Complaint  Patient presents with  . 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 Dorena Cookey says it is now pencil eraser; they are watching it closely; if it grows, it could paralyze her; getting pain in the neck and was holding tension when under more stress lately but stress improved and neck pain got better  Horrible childhood; nightmares; dream catchers; then started to get ringing in her ears; saw ENT; hearing 6 years ago was "perfect"; sound on the right side of the head; right now, multiple pitches; two high on the right, one lower on the left; sometimes the pitches will change when sick, messes with equilibrium, will just step to the side; no falls  She has cardiomyopathy and hypertension; she does not like to take anything if she doesn't have to; she got strep throat and didn't know she got strep throat; she went to bed and tried to sleep it off; by day 3, she still didn't want to go to the doctor; her K+ were lethal, low, rushed her in to the hospital; 6 months later, she was raking her yard and huffing and puffing and then realized this was not her; she got her heart examined; cardiomyopathy from strep, but not rheumatic fever; needs new cardiologist; last  echocardiogram was in [place] or maybe when she first came here  She has had a few cancer scares since she has been here; they found something on the kidneys; urologist is folowing that, Dr. Erlene Quan and Dr. Dorothyann Peng (just does the MRI)  CT abd/pelvis IMPRESSION: 1. No acute intra-abdominopelvic pathology. 2. Multiple left renal lesions including an angiomyolipoma and two cysts. A heterogeneously enhancing exophytic lesion in the region of the left renal pelvis appears similar or slightly increased in size since the prior CT and MRI. 3. A 5 mm nonobstructing right renal inferior pole stone. No hydronephrosis.   Electronically Signed   By: Anner Crete M.D.   On: 06/19/2018 05:09  She is going to need a physical; hx of abnormal breast imaging and was getting them every 6 months and now needs recheck Mammo bilat diaga May 09, 2016:  ASSESSMENT:  BI-RADS Category: 3. Probably benign.  Recommendation Laterality: Both  Short-interval follow-up. Recommend next breast diagnostic mammogram in 6 months.  These results and recommendations were discussed in full with the patient.  Depression screen [facility] 2/9 12/02/2018  Decreased Interest 0  Down, Depressed, Hopeless 0  PHQ - 2 Score 0  Altered sleeping 0  Tired, decreased energy 0  Change in appetite 0  Feeling bad  or failure about yourself  0  Trouble concentrating 0  Moving slowly or fidgety/restless 0  Suicidal thoughts 0  PHQ-9 Score 0  Difficult doing work/chores Somewhat difficult  MD note: not depressed  Fall Risk  12/02/2018  Falls in the past year? 0  Number falls in past yr: 0  Injury with Fall? 0  Follow up Falls evaluation completed    Relevant past medical, surgical, family and social history reviewed Past Medical History:  Diagnosis Date  . Cardiomyopathy ([place])   . Hypertension   . Kidney stone   . PONV (postoperative nausea and vomiting)    HARD TO WAKE UP 20 YEARS AGO FOR KIDNEY STONE-NO PROBLEM WITH  12-22-16 SURGERY   Past Surgical History:  Procedure Laterality Date  . CYSTOSCOPY W/ RETROGRADES Right 12/22/2016   Procedure: CYSTOSCOPY WITH RETROGRADE PYELOGRAM;  Surgeon: Hollice Espy, MD;  Location: [facility];  Service: Urology;  Laterality: Right;  . CYSTOSCOPY W/ URETERAL STENT PLACEMENT Right 01/07/2017   Procedure: CYSTOSCOPY WITH STENT REPLACEMENT;  Surgeon: Hollice Espy, MD;  Location: [facility];  Service: Urology;  Laterality: Right;  . CYSTOSCOPY WITH STENT PLACEMENT Right 12/22/2016   Procedure: CYSTOSCOPY WITH STENT PLACEMENT;  Surgeon: Hollice Espy, MD;  Location: [facility];  Service: Urology;  Laterality: Right;  . URETEROSCOPY WITH HOLMIUM LASER LITHOTRIPSY Right 01/07/2017   Procedure: URETEROSCOPY WITH HOLMIUM LASER LITHOTRIPSY;  Surgeon: Hollice Espy, MD;  Location: [facility] OR[facility];  Service: Urology;  Laterality: Right;   Family History  Problem Relation Age of Onset  . HIV Mother   . Brain cancer Father    Social History   Tobacco Use  . Smoking status: Former Research scientist (life sciences)  . Smokeless tobacco: Former Network engineer Use Topics  . Alcohol use: No  . Drug use: Yes    Types: Marijuana     Office Visit from 12/02/2018 in [facility]  AUDIT-C Score  0      Interim medical history since last visit reviewed. Allergies and medications reviewed  Review of Systems Per HPI unless specifically indicated above     Objective:    BP 120/80 (BP Location: Left Arm, Patient Position: Sitting, Cuff Size: Normal)   Pulse 66   Temp 97.9 F (36.6 C) (Oral)   Resp 16   Ht 5\' 8"  (1.727 m)   Wt 168 lb (76.2 kg)   SpO2 99%   BMI 25.54 kg/m   Wt Readings from Last 3 Encounters:  12/02/18 168 lb (76.2 kg)  06/22/18 157 lb 8 oz (71.4 kg)  06/18/18 160 lb (72.6 kg)    Physical Exam Constitutional:      General: She is not in acute distress.    Appearance: She is well-developed. She is not diaphoretic.  HENT:     Head: Normocephalic and atraumatic.  Eyes:      General: No scleral icterus. Neck:     Thyroid: No thyromegaly.  Cardiovascular:     Rate and Rhythm: Normal rate and regular rhythm.     Heart sounds: Normal heart sounds. No murmur.  Pulmonary:     Effort: Pulmonary effort is normal. No respiratory distress.     Breath sounds: Normal breath sounds. No wheezing.  Abdominal:     General: Bowel sounds are normal. There is no distension.     Palpations: Abdomen is soft.  Musculoskeletal:     Right lower leg: Edema (1+ pitting edema shin) present.     Left lower leg: Edema (1+ pitting  edema shin) present.  Skin:    General: Skin is warm and dry.     Coloration: Skin is not pale.  Neurological:     Mental Status: She is alert.  Psychiatric:        Behavior: Behavior normal.        Thought Content: Thought content normal.        Judgment: Judgment normal.        Assessment & Plan:   Problem List Items Addressed This Visit      Cardiovascular and Mediastinum   Supraventricular tachycardia [facility])    Patient requests seeing a new cardiologist; referral entered      Relevant Orders   Ambulatory referral to Cardiology   Essential hypertension    Controlled today      Cardiomyopathy ([place]) - Primary    Refer back to cardiologist; it sounds like she has not had an echo in quite some time; evidence of pitting edema in the legs      Relevant Orders   Ambulatory referral to Cardiology     Nervous and Auditory   Cyst of brain    I don't have information about this; diagnosed in [place], but has apparently grown slowly over the years; she does not think related to tinnitus; refer to specialist to monitor      Relevant Orders   Ambulatory referral to Neurology     Other   Tinnitus, bilateral    Refer to ENT      Relevant Orders   Ambulatory referral to Neurology   Ambulatory referral to ENT   Breast mass in female    Reviewed [facility] report; not appropriate for screening mammogram; will get diag mamm[facility] at [facility]        Relevant Orders   MM DIAG BREAST TOMO BILATERAL    Other Visit Diagnoses    Screen for colon cancer       Relevant Orders   Cologuard   Encounter for screening mammogram for breast cancer       Relevant Orders   MM 3D SCREEN BREAST BILATERAL       Follow up plan: No follow-ups on file.  An after-visit summary was printed and given to the patient at [facility].  Please see the patient instructions which may contain other information and recommendations beyond what is mentioned above in the assessment and plan.  No orders of the defined types were placed in this encounter.   Orders Placed This Encounter  Procedures  . MM 3D SCREEN BREAST BILATERAL  . MM DIAG BREAST TOMO BILATERAL  . Cologuard  . Ambulatory referral to Neurology  . Ambulatory referral to ENT  . Ambulatory referral to Cardiology

## 2018-12-02 NOTE — Assessment & Plan Note (Signed)
Refer to ENT

## 2019-01-18 ENCOUNTER — Encounter: Payer: BLUE CROSS/BLUE SHIELD | Admitting: Family Medicine

## 2019-01-26 ENCOUNTER — Encounter: Payer: BLUE CROSS/BLUE SHIELD | Admitting: Family Medicine

## 2019-02-16 LAB — COLOGUARD
Cologuard: NEGATIVE
Cologuard: NEGATIVE

## 2019-02-24 ENCOUNTER — Other Ambulatory Visit (HOSPITAL_COMMUNITY)
Admission: RE | Admit: 2019-02-24 | Discharge: 2019-02-24 | Disposition: A | Discharge status: Home / Self Care | Payer: BLUE CROSS/BLUE SHIELD | Source: Ambulatory Visit | Attending: Family Medicine | Admitting: Family Medicine

## 2019-02-24 ENCOUNTER — Encounter: Payer: Self-pay | Admitting: Family Medicine

## 2019-02-24 ENCOUNTER — Other Ambulatory Visit: Payer: Self-pay | Admitting: Family Medicine

## 2019-02-24 ENCOUNTER — Other Ambulatory Visit: Payer: Self-pay

## 2019-02-24 ENCOUNTER — Ambulatory Visit (INDEPENDENT_AMBULATORY_CARE_PROVIDER_SITE_OTHER): Payer: BLUE CROSS/BLUE SHIELD | Admitting: Family Medicine

## 2019-02-24 VITALS — BP 124/82 | HR 83 | Temp 98.0°F | Resp 16 | Ht 68.0 in | Wt 163.4 lb

## 2019-02-24 DIAGNOSIS — I10 Essential (primary) hypertension: Secondary | ICD-10-CM

## 2019-02-24 DIAGNOSIS — Z124 Encounter for screening for malignant neoplasm of cervix: Secondary | ICD-10-CM

## 2019-02-24 DIAGNOSIS — Z01419 Encounter for gynecological examination (general) (routine) without abnormal findings: Secondary | ICD-10-CM

## 2019-02-24 DIAGNOSIS — Z862 Personal history of diseases of the blood and blood-forming organs and certain disorders involving the immune mechanism: Secondary | ICD-10-CM | POA: Diagnosis not present

## 2019-02-24 DIAGNOSIS — Z13220 Encounter for screening for lipoid disorders: Secondary | ICD-10-CM | POA: Diagnosis not present

## 2019-02-24 DIAGNOSIS — Z13820 Encounter for screening for osteoporosis: Secondary | ICD-10-CM

## 2019-02-24 DIAGNOSIS — Z1159 Encounter for screening for other viral diseases: Secondary | ICD-10-CM

## 2019-02-24 DIAGNOSIS — Z872 Personal history of diseases of the skin and subcutaneous tissue: Secondary | ICD-10-CM

## 2019-02-24 DIAGNOSIS — Z1231 Encounter for screening mammogram for malignant neoplasm of breast: Secondary | ICD-10-CM

## 2019-02-24 NOTE — Telephone Encounter (Signed)
Copied from [place] [id number]. Topic: Quick Communication - Rx Refill/Question >> Feb 24, 2019 11:21 AM Reyne Dumas L wrote: Medication: alendronate (FOSAMAX) 70 MG tablet  Has the patient contacted their pharmacy? Yes - pharmacy states no refills on file (Agent: If no, request that the patient contact the pharmacy for the refill.) (Agent: If yes, when and what did the pharmacy advise?)  Preferred Pharmacy (with phone number or street name):[id number] - [place], [place] - [address] [phone] (Phone) [phone] (Fax)  Agent: Please be advised that RX refills may take up to 3 business days. We ask that you follow-up with your pharmacy.

## 2019-02-24 NOTE — Telephone Encounter (Signed)
Please let Kim Riley know that I will send in fosamax if warranted after her bone density scan.  I don't have prior records to see how long/if she was taking this medication, so we need to start by evaluating her severity of osteoporosis.
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
## 2019-02-24 NOTE — Patient Instructions (Signed)
Preventive Care 40-64 Years, Female Preventive care refers to lifestyle choices and visits with your health care provider that can promote health and wellness. What does preventive care include?   A yearly physical exam. This is also called an annual well check.  Dental exams once or twice a year.  Routine eye exams. Ask your health care provider how often you should have your eyes checked.  Personal lifestyle choices, including: ? Daily care of your teeth and gums. ? Regular physical activity. ? Eating a healthy diet. ? Avoiding tobacco and drug use. ? Limiting alcohol use. ? Practicing safe sex. ? Taking low-dose aspirin daily starting at age 50. ? Taking vitamin and mineral supplements as recommended by your health care provider. What happens during an annual well check? The services and screenings done by your health care provider during your annual well check will depend on your age, overall health, lifestyle risk factors, and family history of disease. Counseling Your health care provider may ask you questions about your:  Alcohol use.  Tobacco use.  Drug use.  Emotional well-being.  Home and relationship well-being.  Sexual activity.  Eating habits.  Work and work environment.  Method of birth control.  Menstrual cycle.  Pregnancy history. Screening You may have the following tests or measurements:  Height, weight, and BMI.  Blood pressure.  Lipid and cholesterol levels. These may be checked every 5 years, or more frequently if you are over 50 years old.  Skin check.  Lung cancer screening. You may have this screening every year starting at age 55 if you have a 30-pack-year history of smoking and currently smoke or have quit within the past 15 years.  Colorectal cancer screening. All adults should have this screening starting at age 50 and continuing until age 75. Your health care provider may recommend screening at age 45. You will have tests every  1-10 years, depending on your results and the type of screening test. People at increased risk should start screening at an earlier age. Screening tests may include: ? Guaiac-based fecal occult blood testing. ? Fecal immunochemical test (FIT). ? Stool DNA test. ? Virtual colonoscopy. ? Sigmoidoscopy. During this test, a flexible tube with a tiny camera (sigmoidoscope) is used to examine your rectum and lower colon. The sigmoidoscope is inserted through your anus into your rectum and lower colon. ? Colonoscopy. During this test, a long, thin, flexible tube with a tiny camera (colonoscope) is used to examine your entire colon and rectum.  Hepatitis C blood test.  Hepatitis B blood test.  Sexually transmitted disease (STD) testing.  Diabetes screening. This is done by checking your blood sugar (glucose) after you have not eaten for a while (fasting). You may have this done every 1-3 years.  Mammogram. This may be done every 1-2 years. Talk to your health care provider about when you should start having regular mammograms. This may depend on whether you have a family history of breast cancer.  BRCA-related cancer screening. This may be done if you have a family history of breast, ovarian, tubal, or peritoneal cancers.  Pelvic exam and Pap test. This may be done every 3 years starting at age 21. Starting at age 30, this may be done every 5 years if you have a Pap test in combination with an HPV test.  Bone density scan. This is done to screen for osteoporosis. You may have this scan if you are at high risk for osteoporosis. Discuss your test results, treatment options,   age 30, this may be done every 5 years if you have a Pap test in combination with an HPV test.   Bone density scan. This is done to screen for osteoporosis. You may have this scan if you are at high risk for osteoporosis.  Discuss your test results, treatment options, and if necessary, the need for more tests with your health care provider.  Vaccines  Your health care provider may recommend certain vaccines, such as:   Influenza vaccine. This is recommended every year.   Tetanus, diphtheria, and acellular pertussis (Tdap, Td) vaccine. You may need a Td booster every 10 years.   Varicella  vaccine. You may need this if you have not been vaccinated.   Zoster vaccine. You may need this after age 60.   Measles, mumps, and rubella (MMR) vaccine. You may need at least one dose of MMR if you were born in 1957 or later. You may also need a second dose.   Pneumococcal 13-valent conjugate (PCV13) vaccine. You may need this if you have certain conditions and were not previously vaccinated.   Pneumococcal polysaccharide (PPSV23) vaccine. You may need one or two doses if you smoke cigarettes or if you have certain conditions.   Meningococcal vaccine. You may need this if you have certain conditions.   Hepatitis A vaccine. You may need this if you have certain conditions or if you travel or work in places where you may be exposed to hepatitis A.   Hepatitis B vaccine. You may need this if you have certain conditions or if you travel or work in places where you may be exposed to hepatitis B.   Haemophilus influenzae type b (Hib) vaccine. You may need this if you have certain conditions.  Talk to your health care provider about which screenings and vaccines you need and how often you need them.  This information is not intended to replace advice given to you by your health care provider. Make sure you discuss any questions you have with your health care provider.  Document Released: 10/12/2015 Document Revised: 11/05/2017 Document Reviewed: 07/17/2015  Elsevier Interactive Patient Education  2019 Elsevier Inc.

## 2019-02-25 LAB — CBC WITH DIFFERENTIAL/PLATELET
Absolute Monocytes: 391 cells/uL (ref 200–950)
Basophils Absolute: 41 cells/uL (ref 0–200)
Basophils Relative: 0.9 %
Eosinophils Absolute: 18 cells/uL (ref 15–500)
Eosinophils Relative: 0.4 %
HCT: 38.7 % (ref 35.0–45.0)
Hemoglobin: 12.9 g/dL (ref 11.7–15.5)
Lymphs Abs: 1541 cells/uL (ref 850–3900)
MCH: 30.7 pg (ref 27.0–33.0)
MCHC: 33.3 g/dL (ref 32.0–36.0)
MCV: 92.1 fL (ref 80.0–100.0)
MPV: 12.2 fL (ref 7.5–12.5)
Monocytes Relative: 8.5 %
Neutro Abs: 2608 cells/uL (ref 1500–7800)
Neutrophils Relative %: 56.7 %
Platelets: 225 10*3/uL (ref 140–400)
RBC: 4.2 10*6/uL (ref 3.80–5.10)
RDW: 12.1 % (ref 11.0–15.0)
Total Lymphocyte: 33.5 %
WBC: 4.6 10*3/uL (ref 3.8–10.8)

## 2019-02-25 LAB — COMPLETE METABOLIC PANEL WITH GFR
AG Ratio: 1.7 (calc) (ref 1.0–2.5)
ALT: 12 U/L (ref 6–29)
AST: 18 U/L (ref 10–35)
Albumin: 4.3 g/dL (ref 3.6–5.1)
Alkaline phosphatase (APISO): 54 U/L (ref 37–153)
BUN: 14 mg/dL (ref 7–25)
CO2: 26 mmol/L (ref 20–32)
Calcium: 9.9 mg/dL (ref 8.6–10.4)
Chloride: 104 mmol/L (ref 98–110)
Creat: 0.86 mg/dL (ref 0.50–1.05)
GFR, Est African American: 86 mL/min/{1.73_m2} (ref >=60)
GFR, Est Non African American: 74 mL/min/{1.73_m2} (ref >=60)
Globulin: 2.6 g/dL (calc) (ref 1.9–3.7)
Glucose, Bld: 79 mg/dL (ref 65–99)
Potassium: 4.7 mmol/L (ref 3.5–5.3)
Sodium: 140 mmol/L (ref 135–146)
Total Bilirubin: 0.4 mg/dL (ref 0.2–1.2)
Total Protein: 6.9 g/dL (ref 6.1–8.1)

## 2019-02-25 LAB — LIPID PANEL
Cholesterol: 239 mg/dL — ABNORMAL HIGH (ref <200)
HDL: 55 mg/dL (ref >=50)
LDL Cholesterol (Calc): 161 mg/dL (calc) — ABNORMAL HIGH
Non-HDL Cholesterol (Calc): 184 mg/dL (calc) — ABNORMAL HIGH (ref <130)
Total CHOL/HDL Ratio: 4.3 (calc) (ref <5.0)
Triglycerides: 117 mg/dL (ref <150)

## 2019-02-25 LAB — HEPATITIS C ANTIBODY
Hepatitis C Ab: NONREACTIVE
SIGNAL TO CUT-OFF: 0.01 (ref <1.00)

## 2019-02-28 DIAGNOSIS — E782 Mixed hyperlipidemia: Secondary | ICD-10-CM | POA: Insufficient documentation

## 2019-02-28 DIAGNOSIS — I700 Atherosclerosis of aorta: Secondary | ICD-10-CM | POA: Insufficient documentation

## 2019-02-28 DIAGNOSIS — E785 Hyperlipidemia, unspecified: Secondary | ICD-10-CM | POA: Insufficient documentation

## 2019-02-28 LAB — CYTOLOGY - PAP: Diagnosis: NEGATIVE

## 2019-03-03 ENCOUNTER — Encounter

## 2019-03-03 ENCOUNTER — Ambulatory Visit: Payer: BLUE CROSS/BLUE SHIELD | Admitting: Cardiovascular Disease

## 2019-03-08 ENCOUNTER — Encounter: Payer: BLUE CROSS/BLUE SHIELD | Admitting: Family Medicine

## 2019-03-10 ENCOUNTER — Other Ambulatory Visit: Payer: Self-pay

## 2019-03-10 ENCOUNTER — Ambulatory Visit: Payer: BLUE CROSS/BLUE SHIELD | Admitting: Podiatry

## 2019-03-10 ENCOUNTER — Encounter: Payer: Self-pay | Admitting: Podiatry

## 2019-03-10 DIAGNOSIS — L603 Nail dystrophy: Secondary | ICD-10-CM | POA: Diagnosis not present

## 2019-03-10 NOTE — Progress Notes (Signed)
This patient  presents to the office with pain noted along the inside and outside borders of both big toes.  She has previously had nail surgery for the removal of the ingrowing toenails on all these borders.  She says that it is now painful after a day of walking and wearing her shoes.  She says she has no history of drainage at these sites.  She presents the office today for an evaluation and treatment of this condition.  General Appearance  Alert, conversant and in no acute stress.  Vascular  Dorsalis pedis and posterior pulses are palpable  bilaterally.  Capillary return is within normal limits  bilaterally. Temperature is within normal limits  Bilaterally.  Neurologic  Senn-Weinstein monofilament wire test within normal limits  bilaterally. Muscle power within normal limits bilaterally.  Nails medial and lateral borders of both hallux toenails are removed.    Orthopedic  No limitations of motion of motion feet bilaterally.  No crepitus or effusions noted.  No bony pathology or digital deformities noted.  Skin  normotropic skin with no porokeratosis noted bilaterally.  No signs of infections or ulcers noted.  Nail Dystrophy.  ROV.  Discussed this condition with this patient.  Told her that the nail at the 4 corners of both hallux has thickened causing pain and discomfort.  Therefore using a dermal tool the nail was thinned.  She was also told to put Vaseline in the grooves in  attempt to soften the skin along these grooves.     Gardiner Barefoot DPM

## 2019-05-05 ENCOUNTER — Other Ambulatory Visit: Payer: Self-pay | Admitting: Family Medicine

## 2019-05-05 ENCOUNTER — Telehealth: Payer: Self-pay | Admitting: Family Medicine

## 2019-05-05 DIAGNOSIS — N281 Cyst of kidney, acquired: Secondary | ICD-10-CM

## 2019-05-05 DIAGNOSIS — R1012 Left upper quadrant pain: Secondary | ICD-10-CM

## 2019-05-05 NOTE — Telephone Encounter (Signed)
alendronate (FOSAMAX) 70 MG tablet  Send to [facility] [address]

## 2019-05-09 ENCOUNTER — Other Ambulatory Visit: Payer: Self-pay | Admitting: Emergency Medicine

## 2019-05-09 NOTE — Telephone Encounter (Signed)
order sent to [facility] for refill

## 2019-05-09 NOTE — Telephone Encounter (Signed)
During her May 2020 visit, she had not been taking her Fosamax, and I ordered a Dexa scan to evaluate the need for the medication.  Please advise she have this DEXA scan done now that [facility] has opened back up so that we can determine the need for her Fosamax ongoing.

## 2019-05-13 ENCOUNTER — Ambulatory Visit: Payer: BLUE CROSS/BLUE SHIELD

## 2019-05-16 ENCOUNTER — Telehealth: Payer: Self-pay | Admitting: Urology

## 2019-05-16 NOTE — Telephone Encounter (Signed)
The last imaging I see was from almost a year ago, 05/2018.  Did she have any additional imaging elsewhere, if so which revealed to bring is a dose?  I am happy for her to come to the visit and we can discuss and decide together how to proceed in the safest but most cost effective way.  Hollice Espy, MD

## 2019-05-16 NOTE — Telephone Encounter (Signed)
Pt has a 1 yr follow up next week w/RUS prior.  [employer] told her out of pocket would cost $324 for RUS.  She wants to know if it's necessary to have this again right now.  She has been in the hospital and had some imaging.  She didn't want to cancel her appt w/[facility].

## 2019-05-16 NOTE — Telephone Encounter (Signed)
Please advise on RUS

## 2019-05-17 NOTE — Telephone Encounter (Signed)
Left pt mess to call 

## 2019-05-25 ENCOUNTER — Ambulatory Visit: Payer: BLUE CROSS/BLUE SHIELD | Admitting: Urology

## 2019-05-25 NOTE — Telephone Encounter (Signed)
Spoke with patient today and she agreed to reschd her app for January due to personal issues and she will get the RUS prior to this. I will make sure she get's this scheduled prior to her follow up. App for today has been CX   [employer]

## 2019-10-20 ENCOUNTER — Telehealth: Payer: Self-pay | Admitting: Family Medicine

## 2019-10-20 NOTE — Telephone Encounter (Signed)
Pt wants to know if she can now get an order for a Mammogram and Bonescan at [place].

## 2019-10-20 NOTE — Telephone Encounter (Signed)
Pt called asking for a prescription of Fosamax 70 mg.  She said she seen Kim Riley in May of last year and Kim Riley talked about her getting a Dexa Scan but pt stated because of Covid she never got one done. She said she has been without the Fosafax for a bout a year now,  [place]  CB#  [phone]

## 2019-10-21 NOTE — Telephone Encounter (Signed)
Order was put in on May 28th. She may call [facility] to schedule. Left message for patient

## 2019-10-26 ENCOUNTER — Telehealth: Payer: Self-pay | Admitting: *Deleted

## 2019-10-26 NOTE — Telephone Encounter (Signed)
Left [facility] patient has not completed her RUS-will need to get this done before appointment.

## 2019-10-27 ENCOUNTER — Ambulatory Visit: Payer: BLUE CROSS/BLUE SHIELD | Admitting: Urology

## 2019-10-31 ENCOUNTER — Inpatient Hospital Stay
Admission: RE | Admit: 2019-10-31 | Discharge: 2019-10-31 | Disposition: A | Discharge status: Home / Self Care | Payer: Self-pay | Source: Ambulatory Visit | Attending: *Deleted | Admitting: *Deleted

## 2019-10-31 ENCOUNTER — Other Ambulatory Visit: Payer: Self-pay | Admitting: Family Medicine

## 2019-10-31 ENCOUNTER — Other Ambulatory Visit: Payer: Self-pay | Admitting: *Deleted

## 2019-10-31 DIAGNOSIS — Z1231 Encounter for screening mammogram for malignant neoplasm of breast: Secondary | ICD-10-CM

## 2019-10-31 DIAGNOSIS — R928 Other abnormal and inconclusive findings on diagnostic imaging of breast: Secondary | ICD-10-CM

## 2019-11-18 ENCOUNTER — Telehealth: Payer: Self-pay | Admitting: Family Medicine

## 2019-11-18 NOTE — Telephone Encounter (Signed)
Patient requesting a refill of "calcium medication that does not affect kidneys". Patient states she was previously on this medication but cannot remember the name. Patient has CPE scheduled on 6/1 and has declined appointment at this time. Patient requesting call back today, if possible.  Please advise.

## 2019-11-21 NOTE — Telephone Encounter (Signed)
Called patient and left message for her to get name of medication and call office back

## 2019-11-24 ENCOUNTER — Ambulatory Visit
Admission: RE | Admit: 2019-11-24 | Discharge: 2019-11-24 | Disposition: A | Discharge status: Home / Self Care | Payer: 59 | Source: Ambulatory Visit | Attending: Urology | Admitting: Urology

## 2019-11-24 ENCOUNTER — Ambulatory Visit: Payer: Self-pay

## 2019-11-24 ENCOUNTER — Other Ambulatory Visit: Payer: Self-pay

## 2019-11-24 DIAGNOSIS — N281 Cyst of kidney, acquired: Secondary | ICD-10-CM | POA: Insufficient documentation

## 2019-11-24 NOTE — Telephone Encounter (Signed)
FYI

## 2019-11-24 NOTE — Telephone Encounter (Signed)
Patient called stating that she is having problems passing stool today.  She states that she feels a lot of rectal pressure.  She states that she has had hemorrhoids.  She states that they feel swollen today.  Her last BM was yesterday morning.  She states it was soft and normal amount. She has been taking a diuretic prescribed by cardiologist for about 1 month. She has taken no laxative or enemas. She has no abdominal pain or bloating. Per protocol patient will go to UC today. No appointment at office. She request only female. She has Cardiologist appointment in 1 hour. And [place] appointment today. Care advice read to patient.  She verbalized understanding and will seek care for her issue.  Reason for Disposition . [1] Rectal pain or fullness from fecal impaction (rectum full of stool) AND [2] NOT better after SITZ bath, suppository or enema  Answer Assessment - Initial Assessment Questions 1. STOOL PATTERN OR FREQUENCY: "How often do you pass bowel movements (BMs)?"  (Normal range: tid to q 3 days)  "When was the last BM passed?"       daily 2. STRAINING: "Do you have to strain to have a BM?"     Yes today 3. RECTAL PAIN: "Does your rectum hurt when the stool comes out?" If so, ask: "Do you have hemorrhoids? How bad is the pain?"  (Scale 1-10; or mild, moderate, severe)   Hemorrhoids uncomforable 4. STOOL COMPOSITION: "Are the stools hard?"     Soft yesterday normal amount 5. BLOOD ON STOOLS: "Has there been any blood on the toilet tissue or on the surface of the BM?" If so, ask: "When was the last time?"     no 6. CHRONIC CONSTIPATION: "Is this a new problem for you?"  If no, ask: "How long have you had this problem?" (days, weeks, months)     new 7. CHANGES IN DIET: "Have there been any recent changes in your diet?"     no 8. MEDICATIONS: "Have you been taking any new medications?"     Water pill for about 1 month 9. LAXATIVES: "Have you been using any laxatives or enemas?"  If yes, ask "What,  how often, and when was the last time?"     no 10. CAUSE: "What do you think is causing the constipation?"       Stress possible 11. OTHER SYMPTOMS: "Do you have any other symptoms?" (e.g., abdominal pain, fever, vomiting)      no 12. PREGNANCY: "Is there any chance you are pregnant?" "When was your last menstrual period?"      N/A  Protocols used: CONSTIPATION-A-AH

## 2019-12-07 NOTE — Progress Notes (Signed)
12/08/19 10:37 AM   Kim Riley 1961/02/28 [id number]  Referring provider: Arnetha Courser, MD No address on file  Chief Complaint  Patient presents with  . Nephrolithiasis    HPI: Kim Riley is a 59 y.o. white F with a hx of recurrent renal stone disease who returns today for 1 year f/u with RUS.   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   This directly compared to CT scan from 06/19/18 at which time no solid right sided renal mass appreciated and also compared to multiple MRI reports from [facility] dating back to 2015.   She used to have MRI done once a year, last 2019  She is living a healthy lifestyle including stopping fast food. She is interested in vaccination but has no information regarding it.  She has continued to work but is extremely anxious about COVID19.    No flank pain or urinary symptoms.    She has no experience with virtual visit.   PMH: Past Medical History:  Diagnosis Date  . Cardiomyopathy ([place])   . Hypertension   . Kidney stone   . PONV (postoperative nausea and vomiting)    HARD TO WAKE UP 20 YEARS AGO FOR KIDNEY STONE-NO PROBLEM WITH 12-22-16 SURGERY    Surgical History: Past Surgical History:  Procedure Laterality Date  . CYSTOSCOPY W/ RETROGRADES Right 12/22/2016   Procedure: CYSTOSCOPY WITH RETROGRADE PYELOGRAM;  Surgeon: Hollice Espy, MD;  Location: [facility] ORS;  Service: Urology;  Laterality: Right;  . CYSTOSCOPY W/ URETERAL STENT PLACEMENT Right 01/07/2017   Procedure: CYSTOSCOPY WITH STENT REPLACEMENT;  Surgeon: Hollice Espy, MD;  Location: [facility] ORS;  Service: Urology;  Laterality: Right;  . CYSTOSCOPY WITH STENT PLACEMENT Right 12/22/2016   Procedure: CYSTOSCOPY WITH STENT  PLACEMENT;  Surgeon: Hollice Espy, MD;  Location: [facility] ORS;  Service: Urology;  Laterality: Right;  . URETEROSCOPY WITH HOLMIUM LASER LITHOTRIPSY Right 01/07/2017   Procedure: URETEROSCOPY WITH HOLMIUM LASER LITHOTRIPSY;  Surgeon: Hollice Espy, MD;  Location: [facility] ORS;  Service: Urology;  Laterality: Right;    Home Medications:  Allergies as of 12/08/2019      Reactions   Sulfa Antibiotics Rash, Other (See Comments)   Could've been caused by sun exposure.   Vicodin [hydrocodone-acetaminophen] Rash      Medication List       Accurate as of December 08, 2019 10:37 AM. If you have any questions, ask your nurse or doctor.        alendronate 70 MG tablet Commonly known as: FOSAMAX Take 1 tablet by mouth once a week.   ascorbic acid 1000 MG tablet Commonly known as: VITAMIN C Take by mouth.   atorvastatin 10 MG tablet Commonly known as: LIPITOR Take 10 mg by mouth daily.   carvedilol 6.25 MG tablet Commonly known as: COREG Take 6.25 mg by mouth.   valsartan 80 MG tablet Commonly known as: DIOVAN   Vitamin D-1000 Max St 25 MCG (1000 UT) tablet Generic drug: Cholecalciferol Take by mouth.       Allergies:  Allergies  Allergen Reactions  . Sulfa Antibiotics Rash and Other (See Comments)    Could've been caused by sun exposure.  . Vicodin [Hydrocodone-Acetaminophen] Rash    Family History: Family History  Problem Relation Age of Onset  . HIV Mother   . Brain cancer Father     Social History:  reports that she has quit smoking. She has quit using smokeless tobacco. She reports current drug use. Drug: Marijuana. She reports that she does not drink alcohol.   Physical Exam: BP (!) 162/71   Pulse 71   Ht 5\' 8"  (1.727 m)   Wt 169 lb (76.7 kg)   BMI 25.70 kg/m   Constitutional:  Alert and oriented, No acute distress. HEENT: [place] AT, moist mucus membranes.  Trachea midline, no masses. Cardiovascular: No clubbing, cyanosis, or edema. Respiratory: Normal  respiratory effort, no increased work of breathing. Skin: No rashes, bruises or suspicious lesions. Neurologic: Grossly intact, no focal deficits, moving all 4 extremities. Psychiatric: Normal mood and affect.  Pertinent Imaging: Results for orders placed during the hospital encounter of 11/24/19  Ultrasound renal complete   Narrative CLINICAL DATA:  Renal cyst.  EXAM: RENAL / URINARY TRACT ULTRASOUND COMPLETE  COMPARISON:  CT dated 06/19/2018.  FINDINGS: Right Kidney:  Renal measurements: 10.4 x 3.9 x 4.9 cm = volume: 103 mL. There is a 1.1 x 1.1 x 1 cm hypoechoic mass arising from the upper pole the right kidney. This is not entirely anechoic and appears to demonstrate internal color Doppler flow suggesting that it is a solid lesion. No upper pole cyst measuring 1.1 cm was visualized on the patient's prior CT dated 06/19/2018. A small subcentimeter cyst was located in this region on prior CT but does not correspond well to the current sonographic findings. There is no hydronephrosis.  Left Kidney:  Renal measurements: 11.1 x 5.8 x 4.6 cm = volume: 158 mL. There is a 1.2 cm hypoechoic structure in the interpolar region. There is an additional hyperechoic 1.6 cm structure in the interpolar region. There is no hydronephrosis. There is a 1.5 cm cystic appearing lesion in the lower pole.  Bladder:  Appears normal for degree of bladder distention.  Other:  None.  IMPRESSION: 1. Solid-appearing 1.1 cm nodule rising from the upper pole the right kidney. Follow-up with a renal mass protocol MRI is recommended. 2. 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 3. Indeterminate 1.2 cm hypoechoic structure in the interpolar region of the left kidney may may represent a complex cyst. Attention on follow-up MRI is recommended.   Electronically Signed   By: Constance Holster M.D.   On: 11/24/2019 23:50    I have personally reviewed the  images and agree with radiologist interpretation. This directly compared to CT scan from 06/19/18 at which time no solid right sided renal mass appreciated and also compared to multiple MRI reports from [facility] dating back to 2015.   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 Right solid renal mass 1.1 cm not appreciated on previous MRI / CT scan Given this possibly new lesion would recommend further characterization with repeat MR  MR of abdomen w and w/o contrast ordered Will return for virtual visit with results   3. Vaccine Counseling  Counseled on group eligibility and given location for vaccination Strongly encouraged  Return for Virtual in 3 weeks after MRI.  [facility] [address] [place], [place] [address] [phone]  I, Lucas Mallow, am acting as a  scribe for Dr. Hollice Espy,  I have reviewed the above documentation for accuracy and completeness, and I agree with the above.   Hollice Espy, MD  I spent 30  total minutes on the day of the encounter including pre-visit review of the medical record, face-to-face time with the patient, and post visit ordering of labs/imaging/tests.

## 2019-12-08 ENCOUNTER — Ambulatory Visit (INDEPENDENT_AMBULATORY_CARE_PROVIDER_SITE_OTHER): Payer: 59 | Admitting: Urology

## 2019-12-08 ENCOUNTER — Other Ambulatory Visit: Payer: Self-pay

## 2019-12-08 ENCOUNTER — Encounter: Payer: Self-pay | Admitting: Urology

## 2019-12-08 VITALS — BP 162/71 | HR 71 | Ht 68.0 in | Wt 169.0 lb

## 2019-12-08 DIAGNOSIS — Z7189 Other specified counseling: Secondary | ICD-10-CM

## 2019-12-08 DIAGNOSIS — N2889 Other specified disorders of kidney and ureter: Secondary | ICD-10-CM | POA: Diagnosis not present

## 2019-12-08 DIAGNOSIS — Z7185 Encounter for immunization safety counseling: Secondary | ICD-10-CM

## 2019-12-15 ENCOUNTER — Ambulatory Visit
Admission: RE | Admit: 2019-12-15 | Discharge: 2019-12-15 | Disposition: A | Discharge status: Home / Self Care | Payer: 59 | Source: Ambulatory Visit | Attending: Family Medicine | Admitting: Family Medicine

## 2019-12-15 ENCOUNTER — Encounter: Payer: Self-pay | Admitting: Podiatry

## 2019-12-15 ENCOUNTER — Ambulatory Visit (INDEPENDENT_AMBULATORY_CARE_PROVIDER_SITE_OTHER): Payer: 59 | Admitting: Podiatry

## 2019-12-15 ENCOUNTER — Other Ambulatory Visit: Payer: Self-pay

## 2019-12-15 VITALS — Temp 98.0°F

## 2019-12-15 DIAGNOSIS — R928 Other abnormal and inconclusive findings on diagnostic imaging of breast: Secondary | ICD-10-CM

## 2019-12-15 DIAGNOSIS — L603 Nail dystrophy: Secondary | ICD-10-CM | POA: Diagnosis not present

## 2019-12-15 DIAGNOSIS — Z13820 Encounter for screening for osteoporosis: Secondary | ICD-10-CM | POA: Insufficient documentation

## 2019-12-15 DIAGNOSIS — L600 Ingrowing nail: Secondary | ICD-10-CM | POA: Diagnosis not present

## 2019-12-15 DIAGNOSIS — Z01419 Encounter for gynecological examination (general) (routine) without abnormal findings: Secondary | ICD-10-CM | POA: Diagnosis present

## 2019-12-15 NOTE — Progress Notes (Signed)
This patient  presents to the office with pain noted along the inside border right big toe.  She has had a history of nail surgery but the inside border right big toe is painful and is worsening over the last few months patient denies any drainage at this site but has been applying Vaseline to control the pain  General Appearance  Alert, conversant and in no acute stress.  Vascular  Dorsalis pedis and posterior pulses are palpable  bilaterally.  Capillary return is within normal limits  bilaterally. Temperature is within normal limits  Bilaterally.  Neurologic  Senn-Weinstein monofilament wire test within normal limits  bilaterally. Muscle power within normal limits bilaterally.  Nails   Pain along the medial border right hallux.    Orthopedic  No limitations of motion of motion feet bilaterally.  No crepitus or effusions noted.  No bony pathology or digital deformities noted.  Skin  normotropic skin with no porokeratosis noted bilaterally.  No signs of infections or ulcers noted.  Nail Dystrophy.  ROV.  Discussed this condition with this patient.  Told this patient she needs to have surgery since the nail appears to have regrown in the medial groove right hallux.  She is to make an appointment for surgery in the future for the correction of this nail problem.  RTC prn.       Gardiner Barefoot DPM

## 2019-12-21 NOTE — Progress Notes (Signed)
Name: Kim Riley   MRN: [id number]    DOB: 1961-05-26   Date:12/22/2019       Progress Note  Subjective  Chief Complaint  Chief Complaint  Patient presents with  . Follow-up  . Bone density    review bone density    I connected with  [name]  on 12/22/19 at  7:40 AM EDT by a video enabled telemedicine application and verified that I am speaking with the correct person using two identifiers.  I discussed the limitations of evaluation and management by telemedicine and the availability of in person appointments. The patient expressed understanding and agreed to proceed. Staff also discussed with the patient that there may be a patient responsible charge related to this service. Patient Location: Home Provider Location: [facility] Additional Individuals present: none I phoned the patient after not getting a response after 3 minutes for the telemedicine visit, and continued the visit by phone.   HPI Patient is a 59 year old female patient of Kim Riley who follows up today with request for fosamax refill.  She is postmenopausal and a former smoker  She noted on prior visit with Raquel Sarna that she has a h/o osteoporosis although denied that to me today,  and noted prior was supposed to be taking fosamax. She noted today she started fosamax about 2-3 years ago, then stopped getting script filled and has not taken in past year plus.  She did get a bone density study to help assess the need for Fosamax.  She takes vitamin D presently although has not had a recent vitamin D lab noted. She states she feels well, has no other complaints.  Her most recent bone mineral density test was done on 12/15/19 with the following result: ASSESSMENT:  The BMD measured at Femur Neck Left is 0.807 g/cm2 with a T-score of  -1.7.  This patient is considered OSTEOPENIC according to [facility]) criteria.  Reviewed with her. Noted was positive for osteopenia, not  osteoporosis.  Not take any calcium, takes Vit D daily.  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 Right solid renal mass 1.1 cm not appreciated on previous MRI / CT scan Given this possibly new lesion would recommend further characterization with repeat MR  MR of abdomen w and w/o contrast ordered Will return for virtual visit with results   Her blood pressure was 162/71 noted at that time.  She is followed by cardiology, with her visit on 11/24/2019 with them showing a blood pressure of 110/68.  They noted her blood pressure was stable and to continue her current medication regimen.   Patient Active Problem List   Diagnosis Date Noted  . 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 Atherosclerosis of abdominal aorta ([place]) 02/28/2019  . Hyperlipidemia, mixed 02/28/2019  . Breast mass in female 12/02/2018  . Tinnitus, bilateral 12/02/2018  . 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 Right flank pain   . Ureterolithiasis   . Hydronephrosis 12/22/2016  . Cardiomyopathy ([place]) 07/19/2015  . Essential hypertension 07/19/2015  . Supraventricular tachycardia ([place]) 07/19/2015  . Cancer of kidney ([place]) 06/06/2014    Past Surgical History:  Procedure Laterality Date  . CYSTOSCOPY W/ RETROGRADES Right 12/22/2016   Procedure: CYSTOSCOPY WITH RETROGRADE  PYELOGRAM;  Surgeon: Hollice Espy, MD;  Location: [facility] ORS;  Service: Urology;  Laterality: Right;  . CYSTOSCOPY W/ URETERAL STENT PLACEMENT Right 01/07/2017   Procedure: CYSTOSCOPY WITH STENT REPLACEMENT;  Surgeon: Hollice Espy, MD;  Location: [facility] ORS;  Service: Urology;  Laterality: Right;  . CYSTOSCOPY WITH STENT PLACEMENT Right 12/22/2016   Procedure: CYSTOSCOPY WITH STENT PLACEMENT;  Surgeon: Hollice Espy, MD;  Location:  [facility] ORS;  Service: Urology;  Laterality: Right;  . URETEROSCOPY WITH HOLMIUM LASER LITHOTRIPSY Right 01/07/2017   Procedure: URETEROSCOPY WITH HOLMIUM LASER LITHOTRIPSY;  Surgeon: Hollice Espy, MD;  Location: [facility] ORS;  Service: Urology;  Laterality: Right;    Family History  Problem Relation Age of Onset  . HIV Mother   . Brain cancer Father   . Breast cancer Maternal Grandmother     Social History   Tobacco Use  . Smoking status: Former Research scientist (life sciences)  . Smokeless tobacco: Former Network engineer Use Topics  . Alcohol use: No     Current Outpatient Medications:  .  alendronate (FOSAMAX) 70 MG tablet, Take 1 tablet by mouth once a week., Disp: , Rfl: 1 .  ascorbic acid (VITAMIN C) 1000 MG tablet, Take by mouth., Disp: , Rfl:  .  atorvastatin (LIPITOR) 10 MG tablet, Take 10 mg by mouth daily., Disp: , Rfl:  .  carvedilol (COREG) 6.25 MG tablet, Take 6.25 mg by mouth 2 (two) times daily with a meal., Disp: , Rfl:  .  Cholecalciferol (VITAMIN D-1000 MAX ST) 25 MCG (1000 UT) tablet, Take by mouth., Disp: , Rfl:  .  valsartan (DIOVAN) 80 MG tablet, , Disp: , Rfl:   Allergies  Allergen Reactions  . Sulfa Antibiotics Rash and Other (See Comments)    Could've been caused by sun exposure.  . Vicodin [Hydrocodone-Acetaminophen] Rash    With staff assistance, above reviewed with the patient/caregiver today.   ROS: As per HPI, otherwise no specific complaints on a limited and focused system review   Objective  Virtual encounter, vitals not obtained.  Body mass index is 25.27 kg/m.  Physical Exam  Patient appears in NAD, pleasant Breathing: Effort normal. No respiratory distress. Speaking in complete sentences Neurological: Pt is alert and oriented,  Speech is normal.  Psychiatric: Patient has a normal mood and affect, behavior is normal. Very appropriate with conversation, judgment and thought content normal.   No results found for this or any previous visit (from the past 72  hour(s)).  PHQ2/9: Depression screen [facility] 2/9 12/22/2019 02/24/2019 12/02/2018  Decreased Interest 0 0 0  Down, Depressed, Hopeless 0 0 0  PHQ - 2 Score 0 0 0  Altered sleeping 0 0 0  Tired, decreased energy 0 0 0  Change in appetite 0 0 0  Feeling bad or failure about yourself  0 0 0  Trouble concentrating 0 0 0  Moving slowly or fidgety/restless 0 0 0  Suicidal thoughts 0 0 0  PHQ-9 Score 0 0 0  Difficult doing work/chores Not difficult at all Not difficult at all Somewhat difficult   PHQ-2/9 Result reviewed - neg  Fall Risk: Fall Risk  12/22/2019 02/24/2019 12/02/2018  Falls in the past year? 0 0 0  Number falls in past yr: 0 0 0  Injury with Fall? 0 0 0  Follow up - Falls evaluation completed Falls evaluation completed     Assessment & Plan 1. Osteopenia after menopause Reviewed the bone mineral density results with her today, and the indications for using  Fosamax.  Also discussed the debate about the duration of that therapy.  Given the above bone mineral density results, felt best to not return to taking Fosamax at this time. She will continue taking a vitamin D supplement, and can take some supplemental calcium as well and noted Tums can be a good resource for calcium as an option. Also noted getting a vitamin D level checked at some point in the future when she gets her labs checked again. Also recommended a follow-up bone mineral density test in a year or 2 at the latest to help follow-up.  If some worsening is noted over time with her bone densities, may entertain a medicine again to help.  2. Essential hypertension Continue with her current medication regimen, and cardiology follow-up as planned.    I discussed the assessment and treatment plan with the patient. The patient was provided an opportunity to ask questions and all were answered. The patient agreed with the plan and demonstrated an understanding of the instructions.  I provided 15 minutes of non-face-to-face  time during this encounter that included discussing at length patient's sx/history, pertinent pmhx, medications, treatment and follow up plan. This time also included the necessary documentation, orders, and chart review.

## 2019-12-22 ENCOUNTER — Ambulatory Visit (INDEPENDENT_AMBULATORY_CARE_PROVIDER_SITE_OTHER): Payer: 59 | Admitting: Internal Medicine

## 2019-12-22 ENCOUNTER — Encounter: Payer: Self-pay | Admitting: Internal Medicine

## 2019-12-22 VITALS — Ht 67.75 in | Wt 165.0 lb

## 2019-12-22 DIAGNOSIS — M8580 Other specified disorders of bone density and structure, unspecified site: Secondary | ICD-10-CM

## 2019-12-22 DIAGNOSIS — Z780 Asymptomatic menopausal state: Secondary | ICD-10-CM

## 2019-12-22 DIAGNOSIS — I10 Essential (primary) hypertension: Secondary | ICD-10-CM | POA: Diagnosis not present

## 2019-12-29 ENCOUNTER — Encounter: Payer: Self-pay | Admitting: Podiatry

## 2019-12-29 ENCOUNTER — Ambulatory Visit (INDEPENDENT_AMBULATORY_CARE_PROVIDER_SITE_OTHER): Payer: 59 | Admitting: Podiatry

## 2019-12-29 ENCOUNTER — Other Ambulatory Visit: Payer: Self-pay

## 2019-12-29 VITALS — Temp 97.2°F

## 2019-12-29 DIAGNOSIS — L603 Nail dystrophy: Secondary | ICD-10-CM | POA: Diagnosis not present

## 2019-12-29 DIAGNOSIS — R52 Pain, unspecified: Secondary | ICD-10-CM | POA: Diagnosis not present

## 2019-12-29 DIAGNOSIS — L600 Ingrowing nail: Secondary | ICD-10-CM

## 2019-12-29 NOTE — Progress Notes (Signed)
This patient presents the office with chief complaint of a painful ingrowing toenail on the inside of her right big toe.  She says this was previously corrected surgically but she is experiencing pain and discomfort at the site she returns to the office today for scheduled surgery for the removal of the inside border big toenail right foot.  Vascular  Dorsalis pedis and posterior tibial pulses are palpable  B/L.  Capillary return  WNL.  Temperature gradient is  WNL.  Skin turgor  WNL  Sensorium  Senn Weinstein monofilament wire  WNL. Normal tactile sensation.  Nail Exam  Patient has normal nails with no evidence of bacterial or fungal infection. Marked incurvation medial border right hallux.  Orthopedic  Exam  Muscle tone and muscle strength  WNL.  No limitations of motion feet  B/L.  No crepitus or joint effusion noted.  Foot type is unremarkable and digits show no abnormalities.  Bony prominences are unremarkable.  Skin  No open lesions.  Normal skin texture and turgor.  Nail surgery right hallux  ROV.  Nail surgery.  Treatment options and alternatives discussed.  Recommended permanent phenol matrixectomy and patient agreed.  Right hallux toenail  was prepped with alcohol and a toe block of 5cc of 2% lidocaine plain was administered in a digital toe block. .  The toe was then prepped with betadine solution . A tourniquet was applied to toe. The offending nail border was then excised and matrix tissue exposed.  Phenol was then applied to the matrix tissue followed by an alcohol wash.  Antibiotic ointment and a dry sterile dressing was applied.  The patient was dispensed instructions for aftercare.  RTC 2 weeks.     Gardiner Barefoot DPM

## 2020-01-10 ENCOUNTER — Telehealth: Payer: Self-pay | Admitting: Urology

## 2020-01-10 NOTE — Telephone Encounter (Signed)
Patient did not have her MRI done, she said her son passed away and has to go to [place], she said she would call back later to reschd.   Sharyn Lull

## 2020-01-12 ENCOUNTER — Ambulatory Visit: Payer: 59 | Admitting: Podiatry

## 2020-01-12 ENCOUNTER — Ambulatory Visit: Payer: 59

## 2020-01-18 ENCOUNTER — Telehealth: Payer: Self-pay | Admitting: Urology

## 2020-02-16 ENCOUNTER — Telehealth: Payer: Self-pay

## 2020-02-16 NOTE — Telephone Encounter (Signed)
See below. KW 

## 2020-02-16 NOTE — Telephone Encounter (Signed)
Patient hs an appointment scheduled with you on 02/20/20.     Copied from [place] [phone]. Topic: General - Other >> Feb 16, 2020 12:18 PM Marya Landry D wrote: Reason for CRM: Patient it a prior pateint with [facility] and stated her physician is no longer at the practice

## 2020-02-20 ENCOUNTER — Other Ambulatory Visit: Payer: Self-pay

## 2020-02-20 ENCOUNTER — Encounter: Payer: Self-pay | Admitting: Adult Health

## 2020-02-20 ENCOUNTER — Ambulatory Visit (INDEPENDENT_AMBULATORY_CARE_PROVIDER_SITE_OTHER): Payer: 59 | Admitting: Adult Health

## 2020-02-20 VITALS — BP 94/70 | HR 73 | Temp 96.6°F | Resp 16 | Ht 67.25 in | Wt 170.0 lb

## 2020-02-20 DIAGNOSIS — Z634 Disappearance and death of family member: Secondary | ICD-10-CM

## 2020-02-20 DIAGNOSIS — R82998 Other abnormal findings in urine: Secondary | ICD-10-CM | POA: Insufficient documentation

## 2020-02-20 DIAGNOSIS — F4321 Adjustment disorder with depressed mood: Secondary | ICD-10-CM | POA: Insufficient documentation

## 2020-02-20 DIAGNOSIS — R5383 Other fatigue: Secondary | ICD-10-CM | POA: Diagnosis not present

## 2020-02-20 DIAGNOSIS — E559 Vitamin D deficiency, unspecified: Secondary | ICD-10-CM | POA: Diagnosis not present

## 2020-02-20 DIAGNOSIS — E785 Hyperlipidemia, unspecified: Secondary | ICD-10-CM

## 2020-02-20 DIAGNOSIS — Z6826 Body mass index (BMI) 26.0-26.9, adult: Secondary | ICD-10-CM | POA: Insufficient documentation

## 2020-02-20 DIAGNOSIS — R3129 Other microscopic hematuria: Secondary | ICD-10-CM

## 2020-02-20 LAB — POCT URINALYSIS DIPSTICK
Bilirubin, UA: NEGATIVE
Glucose, UA: NEGATIVE
Ketones, UA: NEGATIVE
Nitrite, UA: NEGATIVE
Protein, UA: NEGATIVE
Spec Grav, UA: <=1.005 — AB (ref 1.010–1.025)
Urobilinogen, UA: 0.2 E.U./dL
pH, UA: 7 (ref 5.0–8.0)
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
## 2020-02-20 NOTE — Progress Notes (Signed)
Sent for urine culture and microscopic. She is asymptomatic.

## 2020-02-20 NOTE — Patient Instructions (Signed)
Health Maintenance, Female Adopting a healthy lifestyle and getting preventive care are important in promoting health and wellness. Ask your health care provider about:  The right schedule for you to have regular tests and exams.  Things you can do on your own to prevent diseases and keep yourself healthy. What should I know about diet, weight, and exercise? Eat a healthy diet   Eat a diet that includes plenty of vegetables, fruits, low-fat dairy products, and lean protein.  Do not eat a lot of foods that are high in solid fats, added sugars, or sodium. Maintain a healthy weight Body mass index (BMI) is used to identify weight problems. It estimates body fat based on height and weight. Your health care provider can help determine your BMI and help you achieve or maintain a healthy weight. Get regular exercise Get regular exercise. This is one of the most important things you can do for your health. Most adults should:  Exercise for at least 150 minutes each week. The exercise should increase your heart rate and make you sweat (moderate-intensity exercise).  Do strengthening exercises at least twice a week. This is in addition to the moderate-intensity exercise.  Spend less time sitting. Even light physical activity can be beneficial. Watch cholesterol and blood lipids Have your blood tested for lipids and cholesterol at 60 years of age, then have this test every 5 years. Have your cholesterol levels checked more often if:  Your lipid or cholesterol levels are high.  You are older than 60 years of age.  You are at high risk for heart disease. What should I know about cancer screening? Depending on your health history and family history, you may need to have cancer screening at various ages. This may include screening for:  Breast cancer.  Cervical cancer.  Colorectal cancer.  Skin cancer.  Lung cancer. What should I know about heart disease, diabetes, and high blood  pressure? Blood pressure and heart disease  High blood pressure causes heart disease and increases the risk of stroke. This is more likely to develop in people who have high blood pressure readings, are of African descent, or are overweight.  Have your blood pressure checked: ? Every 3-5 years if you are 60-39 years of age. ? Every year if you are 40 years old or older. Diabetes Have regular diabetes screenings. This checks your fasting blood sugar level. Have the screening done:  Once every three years after age 60 if you are at a normal weight and have a low risk for diabetes.  More often and at a younger age if you are overweight or have a high risk for diabetes. What should I know about preventing infection? Hepatitis B If you have a higher risk for hepatitis B, you should be screened for this virus. Talk with your health care provider to find out if you are at risk for hepatitis B infection. Hepatitis C Testing is recommended for:  Everyone born from 1945 through 1965.  Anyone with known risk factors for hepatitis C. Sexually transmitted infections (STIs)  Get screened for STIs, including gonorrhea and chlamydia, if: ? You are sexually active and are younger than 60 years of age. ? You are older than 60 years of age and your health care provider tells you that you are at risk for this type of infection. ? Your sexual activity has changed since you were last screened, and you are at increased risk for chlamydia or gonorrhea. Ask your health care provider if   you are at risk.  Ask your health care provider about whether you are at high risk for HIV. Your health care provider may recommend a prescription medicine to help prevent HIV infection. If you choose to take medicine to prevent HIV, you should first get tested for HIV. You should then be tested every 3 months for as long as you are taking the medicine. Pregnancy  If you are about to stop having your period (premenopausal) and  you may become pregnant, seek counseling before you get pregnant.  Take 400 to 800 micrograms (mcg) of folic acid every day if you become pregnant.  Ask for birth control (contraception) if you want to prevent pregnancy. Osteoporosis and menopause Osteoporosis is a disease in which the bones lose minerals and strength with aging. This can result in bone fractures. If you are 60 years old or older, or if you are at risk for osteoporosis and fractures, ask your health care provider if you should:  Be screened for bone loss.  Take a calcium or vitamin D supplement to lower your risk of fractures.  Be given hormone replacement therapy (HRT) to treat symptoms of menopause. Follow these instructions at home: Lifestyle  Do not use any products that contain nicotine or tobacco, such as cigarettes, e-cigarettes, and chewing tobacco. If you need help quitting, ask your health care provider.  Do not use street drugs.  Do not share needles.  Ask your health care provider for help if you need support or information about quitting drugs. Alcohol use  Do not drink alcohol if: ? Your health care provider tells you not to drink. ? You are pregnant, may be pregnant, or are planning to become pregnant.  If you drink alcohol: ? Limit how much you use to 0-1 drink a day. ? Limit intake if you are breastfeeding.  Be aware of how much alcohol is in your drink. In the [place]., one drink equals one 12 oz bottle of beer (355 mL), one 5 oz glass of wine (148 mL), or one 1 oz glass of hard liquor (44 mL). General instructions  Schedule regular health, dental, and eye exams.  Stay current with your vaccines.  Tell your health care provider if: ? You often feel depressed. ? You have ever been abused or do not feel safe at home. Summary  Adopting a healthy lifestyle and getting preventive care are important in promoting health and wellness.  Follow your health care provider's instructions about healthy  diet, exercising, and getting tested or screened for diseases.  Follow your health care provider's instructions on monitoring your cholesterol and blood pressure. This information is not intended to replace advice given to you by your health care provider. Make sure you discuss any questions you have with your health care provider. Document Revised: 09/08/2018 Document Reviewed: 09/08/2018 Elsevier Patient Education  [place] Loss, Adult People experience loss in many different ways throughout their lives. Events such as moving, changing jobs, and losing friends can create a sense of loss. The loss may be as serious as a major health change, divorce, death of a pet, or death of a loved one. All of these types of loss are likely to create a physical and emotional reaction known as grief. Grief is the result of a major change or an absence of something or someone that you count on. Grief is a normal reaction to loss. A variety of factors can affect your grieving experience, including:  The nature of your  loss.  Your relationship to what or whom you lost.  Your understanding of grief and how to manage it.  Your support system. How to manage lifestyle changes Keep to your normal routine as much as possible.  If you have trouble focusing or doing normal activities, it is acceptable to take some time away from your normal routine.  Spend time with friends and loved ones.  Eat a healthy diet, get plenty of sleep, and rest when you feel tired. How to recognize changes  The way that you deal with your grief will affect your ability to function as you normally do. When grieving, you may experience these changes:  Numbness, shock, sadness, anxiety, anger, denial, and guilt.  Thoughts about death.  Unexpected crying.  A physical sensation of emptiness in your stomach.  Problems sleeping and eating.  Tiredness (fatigue).  Loss of interest in normal  activities.  Dreaming about or imagining seeing the person who died.  A need to remember what or whom you lost.  Difficulty thinking about anything other than your loss for a period of time.  Relief. If you have been expecting the loss for a while, you may feel a sense of relief when it happens. Follow these instructions at home:  Activity Express your feelings in healthy ways, such as:  Talking with others about your loss. It may be helpful to find others who have had a similar loss, such as a support group.  Writing down your feelings in a journal.  Doing physical activities to release stress and emotional energy.  Doing creative activities like painting, sculpting, or playing or listening to music.  Practicing resilience. This is the ability to recover and adjust after facing challenges. Reading some resources that encourage resilience may help you to learn ways to practice those behaviors. General instructions  Be patient with yourself and others. Allow the grieving process to happen, and remember that grieving takes time. ? It is likely that you may never feel completely done with some grief. You may find a way to move on while still cherishing memories and feelings about your loss. ? Accepting your loss is a process. It can take months or longer to adjust.  Keep all follow-up visits as told by your health care provider. This is important. Where to find support To get support for managing loss:  Ask your health care provider for help and recommendations, such as grief counseling or therapy.  Think about joining a support group for people who are managing a loss. Where to find more information You can find more information about managing loss from:  American Society of Clinical Oncology: www.cancer.net  American Psychological Association: TVStereos.ch Contact a health care provider if:  Your grief is extreme and keeps getting worse.  You have ongoing grief that does  not improve.  Your body shows symptoms of grief, such as illness.  You feel depressed, anxious, or lonely. Get help right away if:  You have thoughts about hurting yourself or others. If you ever feel like you may hurt yourself or others, or have thoughts about taking your own life, get help right away. You can go to your nearest emergency department or call:  Your local emergency services (911 in the [place]  A suicide crisis helpline, such as the [facility] at 8600692257. This is open 24 hours a day. Summary  Grief is the result of a major change or an absence of someone or something that you count on. Grief  is a normal reaction to loss.  The depth of grief and the period of recovery depend on the type of loss and your ability to adjust to the change and process your feelings.  Processing grief requires patience and a willingness to accept your feelings and talk about your loss with people who are supportive.  It is important to find resources that work for you and to realize that people experience grief differently. There is not one grieving process that works for everyone in the same way.  Be aware that when grief becomes extreme, it can lead to more severe issues like isolation, depression, anxiety, or suicidal thoughts. Talk with your health care provider if you have any of these issues. This information is not intended to replace advice given to you by your health care provider. Make sure you discuss any questions you have with your health care provider. Document Revised: 11/19/2018 Document Reviewed: 01/29/2017 Elsevier Patient Education  [place].

## 2020-02-21 LAB — URINALYSIS, MICROSCOPIC ONLY
Bacteria, UA: NONE SEEN
Casts: NONE SEEN /lpf
Epithelial Cells (non renal): NONE SEEN /hpf (ref 0–10)
RBC, Urine: NONE SEEN /hpf (ref 0–2)

## 2020-02-22 LAB — URINE CULTURE

## 2020-02-22 NOTE — Progress Notes (Signed)
Urine culture shows e- coli, will send in Keflex if patient is in agreement. Keflex 500 mg to take by mouth TID for 7 days.  Return to office by appointment if any symptoms develop/ change or worsen at anytime.  Verify allergies  and preferred pharmacy please.   -- Sulfa Antibiotics -- Rash and Other (See Comments)   --  Could've been caused by sun exposure.  -- Vicodin [Hydrocodone-Acetaminophen] -- Rash

## 2020-02-23 ENCOUNTER — Telehealth: Payer: Self-pay

## 2020-02-23 NOTE — Telephone Encounter (Signed)
Left message for patient to call back okay for [facility] nurse triage to advise. KW

## 2020-02-23 NOTE — Telephone Encounter (Signed)
-----   Message from Doreen Beam, [place] sent at 02/22/2020 11:48 AM EDT ----- Urine culture shows e- coli, will send in Keflex if patient is in agreement. Keflex 500 mg to take by mouth TID for 7 days.  Return to office by appointment if any symptoms develop/ change or worsen at anytime.  Verify allergies  and preferred pharmacy please.   -- Sulfa Antibiotics -- Rash and Other (See Comments)   --  Could've been caused by sun exposure.  -- Vicodin [Hydrocodone-Acetaminophen] -- Rash

## 2020-02-24 ENCOUNTER — Telehealth: Payer: Self-pay | Admitting: Adult Health

## 2020-02-24 MED ORDER — CEPHALEXIN 500 MG PO CAPS: 500.0000 mg | ORAL_CAPSULE | Freq: Three times a day (TID) | ORAL | 0 refills | Status: AC

## 2020-02-24 NOTE — Telephone Encounter (Signed)
Patient returned call and was read lab note by Laverna Peace 02/22/20. All allergies are listed [facility] [place].  Call placed to on call Dr Caryn Section He will order medication.

## 2020-02-24 NOTE — Telephone Encounter (Signed)
Patient called, left VM to return the call to the office. 

## 2020-02-24 NOTE — Telephone Encounter (Signed)
Attempted to call patient with result- left message for patient to call office

## 2020-02-24 NOTE — Addendum Note (Signed)
Addended by: Birdie Sons on: 02/24/2020 05:50 PM   Modules accepted: Orders

## 2020-02-28 ENCOUNTER — Encounter: Payer: BLUE CROSS/BLUE SHIELD | Admitting: Family Medicine

## 2020-02-29 ENCOUNTER — Telehealth: Payer: Self-pay

## 2020-02-29 NOTE — Telephone Encounter (Signed)
-----   Message from Doreen Beam, [place] sent at 02/22/2020 11:48 AM EDT ----- Urine culture shows e- coli, will send in Keflex if patient is in agreement. Keflex 500 mg to take by mouth TID for 7 days.  Return to office by appointment if any symptoms develop/ change or worsen at anytime.  Verify allergies  and preferred pharmacy please.   -- Sulfa Antibiotics -- Rash and Other (See Comments)   --  Could've been caused by sun exposure.  -- Vicodin [Hydrocodone-Acetaminophen] -- Rash

## 2020-02-29 NOTE — Telephone Encounter (Signed)
Patient called, left VM to return the call to the office. 

## 2020-02-29 NOTE — Telephone Encounter (Signed)
Patient called, left VM to return the call to the office for results.  

## 2020-02-29 NOTE — Telephone Encounter (Signed)
Left message for patient to call back, if patient returns call okay for [facility] triage to advise patient of results below. KW

## 2020-03-01 NOTE — Telephone Encounter (Signed)
See Telephone Encounter from 5/28, documenting her being advised about Urine culture results/ antibiotic ordered.

## 2020-03-01 NOTE — Telephone Encounter (Signed)
Phone call to pt. To advise of UC results and recommendations.  Stated "I already spoke to a nurse and I have been taking the medication prescribed."  Verified her drug allergies per request of PCP.  Confirmed that she is allergic to Sulfa and Vicodin.  Reported that she was in her 39's when she was taking Sulfa,  and she developed a rash.  Stated the rash could have been related to sun exposure, while taking the Sulfa.  Pt. Denied any UTI symptoms.  Stated she will complete her Keflex as has been prescribed.

## 2020-03-08 ENCOUNTER — Encounter: Payer: Self-pay | Admitting: Family Medicine

## 2020-04-11 ENCOUNTER — Encounter: Payer: Self-pay | Admitting: Adult Health

## 2020-04-11 ENCOUNTER — Ambulatory Visit (INDEPENDENT_AMBULATORY_CARE_PROVIDER_SITE_OTHER): Payer: 59 | Admitting: Adult Health

## 2020-04-11 ENCOUNTER — Other Ambulatory Visit: Payer: Self-pay

## 2020-04-11 VITALS — BP 110/76 | HR 75 | Temp 96.2°F | Resp 16 | Ht 66.5 in | Wt 169.4 lb

## 2020-04-11 DIAGNOSIS — E785 Hyperlipidemia, unspecified: Secondary | ICD-10-CM

## 2020-04-11 DIAGNOSIS — I10 Essential (primary) hypertension: Secondary | ICD-10-CM

## 2020-04-11 DIAGNOSIS — C649 Malignant neoplasm of unspecified kidney, except renal pelvis: Secondary | ICD-10-CM

## 2020-04-11 DIAGNOSIS — I700 Atherosclerosis of aorta: Secondary | ICD-10-CM

## 2020-04-11 DIAGNOSIS — E559 Vitamin D deficiency, unspecified: Secondary | ICD-10-CM

## 2020-04-11 DIAGNOSIS — G930 Cerebral cysts: Secondary | ICD-10-CM

## 2020-04-11 DIAGNOSIS — I471 Supraventricular tachycardia, unspecified: Secondary | ICD-10-CM

## 2020-04-11 DIAGNOSIS — R0989 Other specified symptoms and signs involving the circulatory and respiratory systems: Secondary | ICD-10-CM

## 2020-04-11 DIAGNOSIS — L812 Freckles: Secondary | ICD-10-CM | POA: Insufficient documentation

## 2020-04-11 DIAGNOSIS — H60503 Unspecified acute noninfective otitis externa, bilateral: Secondary | ICD-10-CM

## 2020-04-11 DIAGNOSIS — Z0000 Encounter for general adult medical examination without abnormal findings: Secondary | ICD-10-CM | POA: Diagnosis not present

## 2020-04-11 DIAGNOSIS — R5383 Other fatigue: Secondary | ICD-10-CM

## 2020-04-11 DIAGNOSIS — H9313 Tinnitus, bilateral: Secondary | ICD-10-CM

## 2020-04-11 DIAGNOSIS — Z1389 Encounter for screening for other disorder: Secondary | ICD-10-CM

## 2020-04-11 DIAGNOSIS — F418 Other specified anxiety disorders: Secondary | ICD-10-CM

## 2020-04-11 DIAGNOSIS — R319 Hematuria, unspecified: Secondary | ICD-10-CM | POA: Diagnosis not present

## 2020-04-11 DIAGNOSIS — X32XXXA Exposure to sunlight, initial encounter: Secondary | ICD-10-CM

## 2020-04-11 DIAGNOSIS — B3324 Viral cardiomyopathy: Secondary | ICD-10-CM

## 2020-04-11 LAB — POCT URINALYSIS DIPSTICK
Bilirubin, UA: NEGATIVE
Glucose, UA: NEGATIVE
Ketones, UA: NEGATIVE
Leukocytes, UA: NEGATIVE
Nitrite, UA: POSITIVE
Protein, UA: NEGATIVE
Spec Grav, UA: >=1.03 — AB (ref 1.010–1.025)
Urobilinogen, UA: 0.2 E.U./dL
pH, UA: 5 (ref 5.0–8.0)

## 2020-04-11 MED ORDER — NEOMYCIN-POLYMYXIN-HC 3.5-10000-1 OT SUSP: 3.0000 [drp] | Freq: Four times a day (QID) | OTIC | 1 refills | Status: DC

## 2020-04-11 MED ORDER — ESCITALOPRAM OXALATE 10 MG PO TABS: 10.0000 mg | ORAL_TABLET | Freq: Every day | ORAL | 1 refills | Status: DC

## 2020-04-11 NOTE — Progress Notes (Signed)
Complete physical exam   Patient: Kim Riley   DOB: 02/20/1961   59 y.o. Female  MRN: [id number] Visit Date: 04/11/2020  Today's healthcare provider: Marcille Buffy, FNP  Clint Bolder as a scribe for [place], FNP.,have documented all relevant documentation on the behalf of [place] Lenward Able, FNP,as directed by  Marcille Buffy, FNP while in the presence of Marcille Buffy, [place]. Chief Complaint  Patient presents with   Annual Exam   Subjective    Kim Riley is a 59 y.o. female who presents today for a complete physical exam.   HPI  She reports consuming a general diet. The patient does not participate in regular exercise at present. She generally feels fairly well. She reports sleeping poorly. She does have additional problems to discuss today. Patient states that she would like to address sounds that she reports that she hears in her head. Patient states that throughout her day " I hear different pitch sounds that play back and forth in my head, for the most part I can act normal and people do not notice." Patient reports that high pitch sounds she hears in her "head" cause her to be off balance. She states that she has seen neurology for cyst on right side of head that was described as pinpoint size . Patient reports that she w was referred to ENT for high pitch sounds, patient states that ENT consult was normal and states that doctor could not explain to her why she was hearing high pitch sounds..  Patient reports that she had childhood trauma and states as a child she had informed her mother of being able to hear sounds in her head and states that her mother told her someone was watching her.  Patient states that pitch sounds ado not affect her mentally or emotionally. Patient states that she would like to address concerns of anxiety patient states that she constantly worries and has crying episodes, she  states that she seems to work herself up and worry about family and those surrounded by her, who dont know her.   Patient states that she worries about her brother and children, patient states that her son was murdered in April in his bed while sleeping by his friends, she states that her other son is getting married and is wanting financial assistance with wedding and patient is worried about being away from her brother and traveling. Patient has been offered to see psychiatry to help deal with grief and symptoms of anxiety but has declined today.   Denies any suicidal thoughts or intents. Denies any homicidal thoughts or intents. Last reported Pap 02/24/2019- normal due 2023.   [facility]. [facility].    Patient  denies any fever, body aches,chills, rash, chest pain, shortness of breath, nausea, vomiting, or diarrhea. Denies dizziness, lightheadedness, pre syncopal or syncopal episodes.    Past Medical History:  Diagnosis Date   Allergy    Anxiety    Cardiomyopathy ([place])    Hypertension    Kidney stone    PONV (postoperative nausea and vomiting)    HARD TO WAKE UP 20 YEARS AGO FOR KIDNEY STONE-NO PROBLEM WITH 12-22-16 SURGERY   Past Surgical History:  Procedure Laterality Date   APPENDECTOMY     CYSTOSCOPY W/ RETROGRADES Right 12/22/2016   Procedure: CYSTOSCOPY WITH RETROGRADE PYELOGRAM;  Surgeon: Hollice Espy, MD;  Location: [facility] OR[facility];  Service: Urology;  Laterality: Right;   CYSTOSCOPY  W/ URETERAL STENT PLACEMENT Right 01/07/2017   Procedure: CYSTOSCOPY WITH STENT REPLACEMENT;  Surgeon: Hollice Espy, MD;  Location: [facility];  Service: Urology;  Laterality: Right;   CYSTOSCOPY WITH STENT PLACEMENT Right 12/22/2016   Procedure: CYSTOSCOPY WITH STENT PLACEMENT;  Surgeon: Hollice Espy, MD;  Location: [facility];  Service: Urology;  Laterality: Right;   URETEROSCOPY WITH HOLMIUM LASER LITHOTRIPSY Right 01/07/2017   Procedure: URETEROSCOPY WITH  HOLMIUM LASER LITHOTRIPSY;  Surgeon: Hollice Espy, MD;  Location: [facility];  Service: Urology;  Laterality: Right;   Social History   Socioeconomic History   Marital status: Single    Spouse name: Not on file   Number of children: 2   Years of education: GED   Highest education level: Not on file  Occupational History   Not on file  Tobacco Use   Smoking status: Former Smoker   Smokeless tobacco: Former Systems developer   Tobacco comment: patient reports that she quit smoking in 1983  Vaping Use   Vaping Use: Never used  Substance and Sexual Activity   Alcohol use: No   Drug use: Yes    Types: Marijuana   Sexual activity: Yes    Partners: Male    Birth control/protection: Post-menopausal  Other Topics Concern   Not on file  Social History Narrative   Not on file   Social Determinants of Health   Financial Resource Strain:    Difficulty of Paying Living Expenses:   Food Insecurity:    Worried About Charity fundraiser in the Last Year:    Arboriculturist in the Last Year:   Transportation Needs:    Film/video editor (Medical):    Lack of Transportation (Non-Medical):   Physical Activity:    Days of Exercise per Week:    Minutes of Exercise per Session:   Stress:    Feeling of Stress :   Social Connections:    Frequency of Communication with Friends and Family:    Frequency of Social Gatherings with Friends and Family:    Attends Religious Services:    Active Member of Clubs or Organizations:    Attends Music therapist:    Marital Status:   Intimate Partner Violence:    Fear of Current or Ex-Partner:    Emotionally Abused:    Physically Abused:    Sexually Abused:    Family Status  Relation Name Status   Mother  Deceased   Father  Deceased   Sister  Alive   Brother  Deceased   Daughter  Deceased   Sister  Deceased   MGM  (Not Specified)   MGF  (Not Specified)   Family History  Problem Relation Age of  Onset   HIV Mother    Brain cancer Father    Breast cancer Maternal Grandmother    Heart Problems Maternal Grandfather    Allergies  Allergen Reactions   Sulfa Antibiotics Rash and Other (See Comments)    Could've been caused by sun exposure.   Vicodin [Hydrocodone-Acetaminophen] Rash    Patient Care Team: Candy Ziegler, Kelby Aline, FNP as PCP - General (Family Medicine) Hollice Espy, MD as Consulting Physician (Urology)   Medications: Outpatient Medications Prior to Visit  Medication Sig   ascorbic acid (VITAMIN C) 1000 MG tablet Take by mouth.   atorvastatin (LIPITOR) 10 MG tablet Take 10 mg by mouth daily.   carvedilol (COREG) 6.25 MG tablet Take 6.25 mg by mouth 2 (two) times daily with a meal.  Cholecalciferol (VITAMIN D-1000 MAX ST) 25 MCG (1000 UT) tablet Take by mouth.   NON FORMULARY Patient reports taking Mega Red otc   spironolactone (ALDACTONE) 25 MG tablet Take 25 mg by mouth daily.   valsartan (DIOVAN) 80 MG tablet    No facility-administered medications prior to visit.    Review of Systems  Constitutional: Negative.   HENT: Positive for ear discharge and tinnitus. Negative for congestion, dental problem, drooling, ear pain, facial swelling, hearing loss, mouth sores, nosebleeds, postnasal drip, rhinorrhea, sinus pressure, sinus pain, sneezing, sore throat, trouble swallowing and voice change.   Respiratory: Negative.   Cardiovascular: Negative.   Gastrointestinal: Negative.   Genitourinary: Negative.   Musculoskeletal: Negative.   Skin: Negative.   Neurological: Positive for headaches. Negative for dizziness, tremors, seizures, syncope, facial asymmetry, speech difficulty, weakness, light-headedness and numbness.  Hematological: Negative.   Psychiatric/Behavioral: Negative for self-injury, sleep disturbance and suicidal ideas. The patient is nervous/anxious.    Not gone for labs yet will go.    Objective    BP 110/76    Pulse 75    Temp (!)  96.2 F (35.7 C) (Oral)    Resp 16    Ht 5' 6.5" (1.689 m)    Wt 169 lb 6.4 oz (76.8 kg)    SpO2 99%    BMI 26.93 kg/m  BP Readings from Last 3 Encounters:  04/11/20 110/76  02/20/20 94/70  12/08/19 (!) 162/71   Wt Readings from Last 3 Encounters:  04/11/20 169 lb 6.4 oz (76.8 kg)  02/20/20 170 lb (77.1 kg)  12/22/19 165 lb (74.8 kg)      Physical Exam Vitals reviewed. Exam conducted with a chaperone present.  Constitutional:      Appearance: Normal appearance. She is normal weight.  HENT:     Head: Normocephalic and atraumatic.     Right Ear: External ear normal. A middle ear effusion is present. Tympanic membrane is not erythematous.     Left Ear: External ear normal. A middle ear effusion is present. Tympanic membrane is not erythematous.     Ears:     Comments: Erythema to canals on both sides Eyes:     Extraocular Movements: Extraocular movements intact.     Conjunctiva/sclera: Conjunctivae normal.  Cardiovascular:     Rate and Rhythm: Normal rate and regular rhythm.     Pulses:          Carotid pulses are 2+ on the right side with bruit and 2+ on the left side with bruit.    Heart sounds: Normal heart sounds.     Comments: Mild turbulance bilateral carotid Pulmonary:     Effort: Pulmonary effort is normal. No respiratory distress.     Breath sounds: Normal breath sounds. No stridor. No wheezing, rhonchi or rales.  Chest:     Chest wall: No tenderness.     Breasts: Tanner Score is 5.        Right: Normal. No inverted nipple, nipple discharge, skin change or tenderness.        Left: Normal. No inverted nipple, nipple discharge, skin change or tenderness.  Abdominal:     General: There is no distension.     Palpations: Abdomen is soft.     Tenderness: There is no abdominal tenderness.  Genitourinary:    Exam position: Lithotomy position.     Pubic Area: No rash or pubic lice.      Tanner stage (genital): 5.     Labia:  Right: No rash or tenderness.         Left: No rash or tenderness.      Urethra: No prolapse, urethral pain, urethral swelling or urethral lesion.     Vagina: Normal.     Cervix: Normal.     Uterus: Normal.      Adnexa: Right adnexa normal.     Rectum: No external hemorrhoid.  Musculoskeletal:        General: Normal range of motion.     Cervical back: Normal range of motion and neck supple.  Lymphadenopathy:     Upper Body:     Right upper body: No supraclavicular, axillary or pectoral adenopathy.     Left upper body: No supraclavicular, axillary or pectoral adenopathy.  Skin:    General: Skin is dry.  Neurological:     Mental Status: She is alert and oriented to person, place, and time.     Motor: No weakness.     Gait: Gait normal.  Psychiatric:        Attention and Perception: Attention normal. She perceives auditory hallucinations.        Mood and Affect: Mood is anxious and depressed. Affect is tearful.        Speech: Speech normal.        Behavior: Behavior is cooperative.        Thought Content: Thought content is paranoid.        Cognition and Memory: Cognition normal.      GAD 7 : Generalized Anxiety Score 04/11/2020 02/20/2020 02/24/2019 12/02/2018  Nervous, Anxious, on Edge 0 0 0 1  Control/stop worrying 1 1 0 2  Worry too much - different things 3 3 1 2   Trouble relaxing 0 2 0 3  Restless 0 0 0 0  Easily annoyed or irritable 0 0 0 0  Afraid - awful might happen 2 3 0 0  Total GAD 7 Score 6 9 1 8   Anxiety Difficulty Somewhat difficult Somewhat difficult Not difficult at all Somewhat difficult    Last depression screening scores PHQ 2/9 Scores 04/11/2020 02/20/2020 12/22/2019  PHQ - 2 Score 0 - 0  PHQ- 9 Score 0 - 0  Exception Documentation - Patient refusal -   Last fall risk screening Fall Risk  02/20/2020  Falls in the past year? 0  Number falls in past yr: 0  Injury with Fall? 0  Follow up -   Last Audit-C alcohol use screening Alcohol Use Disorder Test (AUDIT) 02/20/2020  1. How often do you  have a drink containing alcohol? 0  2. How many drinks containing alcohol do you have on a typical day when you are drinking? 0  3. How often do you have six or more drinks on one occasion? 0  AUDIT-C Score 0   A score of 3 or more in women, and 4 or more in men indicates increased risk for alcohol abuse, EXCEPT if all of the points are from question 1   Results for orders placed or performed in visit on 04/11/20  POCT urinalysis dipstick  Result Value Ref Range   Color, UA dark yellow    Clarity, UA clear    Glucose, UA Negative Negative   Bilirubin, UA negative    Ketones, UA negative    Spec Grav, UA >=1.030 (A) 1.010 - 1.025   Blood, UA large    pH, UA 5.0 5.0 - 8.0   Protein, UA Negative Negative   Urobilinogen, UA 0.2 0.2  or 1.0 E.U./dL   Nitrite, UA positive    Leukocytes, UA Negative Negative   Appearance     Odor      Assessment & Plan   1. Screening for blood or protein in urine  - POCT urinalysis dipstick  2. Acute otitis externa of both ears, unspecified type recheck in 3 weeks  - neomycin-polymyxin-hydrocortisone (CORTISPORIN) 3.5-10000-1 OTIC suspension; Place 3 drops into both ears 4 (four) times daily.  Dispense: 10 mL; Refill: 1  3. Vitamin D deficiency Taking supplement currently will check  - VITAMIN D 25 Hydroxy (Vit-D Deficiency, Fractures)  4. Bilateral carotid bruits Mild  - US Carotid Bilateral  5. Moderate sun exposure, initial encounter Full body skin check at dermatology.  - Ambulatory referral to Dermatology  6. Freckles - Ambulatory referral to Dermatology  7. Tinnitus, bilateral She reports ENT cleared her in [place] and she would not like ENT referral at this time.  - Ambulatory referral to Neurology  8. Cyst of brain Records requested.  - Ambulatory referral to Neurology  9. Anxiety associated with depression  Discussed known black box warning for anti depression/ anxiety medication. Need to report any behavioral changes  right, if any homicidal or suicidal thoughts or ideas seek medical attention right away. Call 911.  Also given counseling card to [facility] in [place].  She declined psychiatry at this time.  - escitalopram (LEXAPRO) 10 MG tablet; Take 1 tablet (10 mg total) by mouth daily.  Dispense: 30 tablet; Refill: 1  10. Essential hypertension Followed by cardiology. Continue current medications.  - TSH  11. Hyperlipidemia, unspecified hyperlipidemia type On Lipitor continue.  - Lipid panel - Comprehensive metabolic panel  12. Fatigue, unspecified type History of anemia.  - CBC with Differential/Platelet  13. Routine health maintenance The patient is advised to begin progressive daily aerobic exercise program, attempt to lose weight, reduce salt in diet and cooking, continue current medications, continue current healthy lifestyle patterns and return for routine annual checkups.   PAP is due 01/2022 per records.  - CBC with Differential/Platelet - Lipid panel - TSH - Comprehensive metabolic panel   Routine Health Maintenance and Physical Exam  Exercise Activities and Dietary recommendations Goals   None     Immunization History  Administered Date(s) Administered   Tdap 09/27/2017    Health Maintenance  Topic Date Due   COVID-19 Vaccine (1) Never done   INFLUENZA VACCINE  04/29/2020   MAMMOGRAM  12/14/2020   Fecal DNA (Cologuard)  02/15/2022   PAP SMEAR-Modifier  02/23/2022   TETANUS/TDAP  09/28/2027   Hepatitis C Screening  Completed   HIV Screening  Completed    Discussed health benefits of physical activity, and encouraged her to engage in regular exercise appropriate for her age and condition.  recheck on ears and lexapro. Clarify on PAP not due until 01/2022. Review labs.   Return in about 1 month (around 05/12/2020).      Advised patient call the office or your primary care doctor for an appointment if no improvement within 72 hours or if any  symptoms change or worsen at any time  Advised ER or urgent Care if after hours or on weekend. Call 911 for emergency symptoms at any time.P[place]et verbalized understanding of all instructions given/reviewed and treatment plan and has no further questions or concerns at this time.      Hartford Poli, CMA, have reviewed all documentation for this visit. The documentation on 04/11/20 for the  exam, diagnosis, procedures, and orders are all accurate and complete.   Marcille Buffy, [place] [phone] (phone) [phone] (fax)  [place]

## 2020-04-11 NOTE — Patient Instructions (Addendum)
[place] for opthamology exam  Health Maintenance, Female Adopting a healthy lifestyle and getting preventive care are important in promoting health and wellness. Ask your health care provider about:  The right schedule for you to have regular tests and exams.  Things you can do on your own to prevent diseases and keep yourself healthy. What should I know about diet, weight, and exercise? Eat a healthy diet   Eat a diet that includes plenty of vegetables, fruits, low-fat dairy products, and lean protein.  Do not eat a lot of foods that are high in solid fats, added sugars, or sodium. Maintain a healthy weight Body mass index (BMI) is used to identify weight problems. It estimates body fat based on height and weight. Your health care provider can help determine your BMI and help you achieve or maintain a healthy weight. Get regular exercise Get regular exercise. This is one of the most important things you can do for your health. Most adults should:  Exercise for at least 150 minutes each week. The exercise should increase your heart rate and make you sweat (moderate-intensity exercise).  Do strengthening exercises at least twice a week. This is in addition to the moderate-intensity exercise.  Spend less time sitting. Even light physical activity can be beneficial. Watch cholesterol and blood lipids Have your blood tested for lipids and cholesterol at 59 years of age, then have this test every 5 years. Have your cholesterol levels checked more often if:  Your lipid or cholesterol levels are high.  You are older than 60 years of age.  You are at high risk for heart disease. What should I know about cancer screening? Depending on your health history and family history, you may need to have cancer screening at various ages. This may include screening for:  Breast cancer.  Cervical cancer.  Colorectal cancer.  Skin cancer.  Lung cancer. What should I know  about heart disease, diabetes, and high blood pressure? Blood pressure and heart disease  High blood pressure causes heart disease and increases the risk of stroke. This is more likely to develop in people who have high blood pressure readings, are of African descent, or are overweight.  Have your blood pressure checked: ? Every 3-5 years if you are 46-29 years of age. ? Every year if you are 25 years old or older. Diabetes Have regular diabetes screenings. This checks your fasting blood sugar level. Have the screening done:  Once every three years after age 89 if you are at a normal weight and have a low risk for diabetes.  More often and at a younger age if you are overweight or have a high risk for diabetes. What should I know about preventing infection? Hepatitis B If you have a higher risk for hepatitis B, you should be screened for this virus. Talk with your health care provider to find out if you are at risk for hepatitis B infection. Hepatitis C Testing is recommended for:  Everyone born from [birth year] through 1966.  Anyone with known risk factors for hepatitis C. Sexually transmitted infections (STIs)  Get screened for STIs, including gonorrhea and chlamydia, if: ? You are sexually active and are younger than 60 years of age. ? You are older than 60 years of age and your health care provider tells you that you are at risk for this type of infection. ? Your sexual activity has changed since you were last screened, and you are at increased risk for chlamydia  or gonorrhea. Ask your health care provider if you are at risk.  Ask your health care provider about whether you are at high risk for HIV. Your health care provider may recommend a prescription medicine to help prevent HIV infection. If you choose to take medicine to prevent HIV, you should first get tested for HIV. You should then be tested every 3 months for as long as you are taking the medicine. Pregnancy  If you are about  to stop having your period (premenopausal) and you may become pregnant, seek counseling before you get pregnant.  Take 400 to 800 micrograms (mcg) of folic acid every day if you become pregnant.  Ask for birth control (contraception) if you want to prevent pregnancy. Osteoporosis and menopause Osteoporosis is a disease in which the bones lose minerals and strength with aging. This can result in bone fractures. If you are 35 years old or older, or if you are at risk for osteoporosis and fractures, ask your health care provider if you should:  Be screened for bone loss.  Take a calcium or vitamin D supplement to lower your risk of fractures.  Be given hormone replacement therapy (HRT) to treat symptoms of menopause. Follow these instructions at home: Lifestyle  Do not use any products that contain nicotine or tobacco, such as cigarettes, e-cigarettes, and chewing tobacco. If you need help quitting, ask your health care provider.  Do not use street drugs.  Do not share needles.  Ask your health care provider for help if you need support or information about quitting drugs. Alcohol use  Do not drink alcohol if: ? Your health care provider tells you not to drink. ? You are pregnant, may be pregnant, or are planning to become pregnant.  If you drink alcohol: ? Limit how much you use to 0-1 drink a day. ? Limit intake if you are breastfeeding.  Be aware of how much alcohol is in your drink. In the [place] one drink equals one 12 oz bottle of beer (355 mL), one 5 oz glass of wine (148 mL), or one 1 oz glass of hard liquor (44 mL). General instructions  Schedule regular health, dental, and eye exams.  Stay current with your vaccines.  Tell your health care provider if: ? You often feel depressed. ? You have ever been abused or do not feel safe at home. Summary  Adopting a healthy lifestyle and getting preventive care are important in promoting health and wellness.  Follow your  health care provider's instructions about healthy diet, exercising, and getting tested or screened for diseases.  Follow your health care provider's instructions on monitoring your cholesterol and blood pressure. This information is not intended to replace advice given to you by your health care provider. Make sure you discuss any questions you have with your health care provider. Document Revised: 09/08/2018 Document Reviewed: 09/08/2018 Elsevier Patient Education  2020 [place] Breast self-awareness is knowing how your breasts look and feel. Doing breast self-awareness is important. It allows you to catch a breast problem early while it is still small and can be treated. All women should do breast self-awareness, including women who have had breast implants. Tell your doctor if you notice a change in your breasts. What you need:  A mirror.  A well-lit room. How to do a breast self-exam A breast self-exam is one way to learn what is normal for your breasts and to check for changes. To do a breast self-exam:  Look for changes  1. Take off all the clothes above your waist. 2. Stand in front of a mirror in a room with good lighting. 3. Put your hands on your hips. 4. Push your hands down. 5. Look at your breasts and nipples in the mirror to see if one breast or nipple looks different from the other. Check to see if: ? The shape of one breast is different. ? The size of one breast is different. ? There are wrinkles, dips, and bumps in one breast and not the other. 6. Look at each breast for changes in the skin, such as: ? Redness. ? Scaly areas. 7. Look for changes in your nipples, such as: ? Liquid around the nipples. ? Bleeding. ? Dimpling. ? Redness. ? A change in where the nipples are. Feel for changes  1. Lie on your back on the floor. 2. Feel each breast. To do this, follow these steps: ? Pick a breast to feel. ? Put the arm closest to that  breast above your head. ? Use your other arm to feel the nipple area of your breast. Feel the area with the pads of your three middle fingers by making small circles with your fingers. For the first circle, press lightly. For the second circle, press harder. For the third circle, press even harder. ? Keep making circles with your fingers at the different pressures as you move down your breast. Stop when you feel your ribs. ? Move your fingers a little toward the center of your body. ? Start making circles with your fingers again, this time going up until you reach your collarbone. ? Keep making up-and-down circles until you reach your armpit. Remember to keep using the three pressures. ? Feel the other breast in the same way. 3. Sit or stand in the tub or shower. 4. With soapy water on your skin, feel each breast the same way you did in step 2 when you were lying on the floor. Write down what you find Writing down what you find can help you remember what to tell your doctor. Write down:  What is normal for each breast.  Any changes you find in each breast, including: ? The kind of changes you find. ? Whether you have pain. ? Size and location of any lumps.  When you last had your menstrual period. General tips  Check your breasts every month.  If you are breastfeeding, the best time to check your breasts is after you feed your baby or after you use a breast pump.  If you get menstrual periods, the best time to check your breasts is 5-7 days after your menstrual period is over.  With time, you will become comfortable with the self-exam, and you will begin to know if there are changes in your breasts. Contact a doctor if you:  See a change in the shape or size of your breasts or nipples.  See a change in the skin of your breast or nipples, such as red or scaly skin.  Have fluid coming from your nipples that is not normal.  Find a lump or thick area that was not there  before.  Have pain in your breasts.  Have any concerns about your breast health. Summary  Breast self-awareness includes looking for changes in your breasts, as well as feeling for changes within your breasts.  Breast self-awareness should be done in front of a mirror in a well-lit room.  You should check your breasts every  month. If you get menstrual periods, the best time to check your breasts is 5-7 days after your menstrual period is over.  Let your doctor know of any changes you see in your breasts, including changes in size, changes on the skin, pain or tenderness, or fluid from your nipples that is not normal. This information is not intended to replace advice given to you by your health care provider. Make sure you discuss any questions you have with your health care provider. Document Revised: 05/04/2018 Document Reviewed: 05/04/2018 Elsevier Patient Education  [place] Loss, Adult People experience loss in many different ways throughout their lives. Events such as moving, changing jobs, and losing friends can create a sense of loss. The loss may be as serious as a major health change, divorce, death of a pet, or death of a loved one. All of these types of loss are likely to create a physical and emotional reaction known as grief. Grief is the result of a major change or an absence of something or someone that you count on. Grief is a normal reaction to loss. A variety of factors can affect your grieving experience, including:  The nature of your loss.  Your relationship to what or whom you lost.  Your understanding of grief and how to manage it.  Your support system. How to manage lifestyle changes Keep to your normal routine as much as possible.  If you have trouble focusing or doing normal activities, it is acceptable to take some time away from your normal routine.  Spend time with friends and loved ones.  Eat a healthy diet, get plenty of  sleep, and rest when you feel tired. How to recognize changes  The way that you deal with your grief will affect your ability to function as you normally do. When grieving, you may experience these changes:  Numbness, shock, sadness, anxiety, anger, denial, and guilt.  Thoughts about death.  Unexpected crying.  A physical sensation of emptiness in your stomach.  Problems sleeping and eating.  Tiredness (fatigue).  Loss of interest in normal activities.  Dreaming about or imagining seeing the person who died.  A need to remember what or whom you lost.  Difficulty thinking about anything other than your loss for a period of time.  Relief. If you have been expecting the loss for a while, you may feel a sense of relief when it happens. Follow these instructions at home:  Activity Express your feelings in healthy ways, such as:  Talking with others about your loss. It may be helpful to find others who have had a similar loss, such as a support group.  Writing down your feelings in a journal.  Doing physical activities to release stress and emotional energy.  Doing creative activities like painting, sculpting, or playing or listening to music.  Practicing resilience. This is the ability to recover and adjust after facing challenges. Reading some resources that encourage resilience may help you to learn ways to practice those behaviors. General instructions  Be patient with yourself and others. Allow the grieving process to happen, and remember that grieving takes time. ? It is likely that you may never feel completely done with some grief. You may find a way to move on while still cherishing memories and feelings about your loss. ? Accepting your loss is a process. It can take months or longer to adjust.  Keep all follow-up visits as told by your health care provider. This is  important. Where to find support To get support for managing loss:  Ask your health care provider  for help and recommendations, such as grief counseling or therapy.  Think about joining a support group for people who are managing a loss. Where to find more information You can find more information about managing loss from:  American Society of Clinical Oncology: www.cancer.net  American Psychological Association: TVStereos.ch Contact a health care provider if:  Your grief is extreme and keeps getting worse.  You have ongoing grief that does not improve.  Your body shows symptoms of grief, such as illness.  You feel depressed, anxious, or lonely. Get help right away if:  You have thoughts about hurting yourself or others. If you ever feel like you may hurt yourself or others, or have thoughts about taking your own life, get help right away. You can go to your nearest emergency department or call:  Your local emergency services (911 in the [place]  A suicide crisis helpline, such as the [place] at 947-665-3911. This is open 24 hours a day. Summary  Grief is the result of a major change or an absence of someone or something that you count on. Grief is a normal reaction to loss.  The depth of grief and the period of recovery depend on the type of loss and your ability to adjust to the change and process your feelings.  Processing grief requires patience and a willingness to accept your feelings and talk about your loss with people who are supportive.  It is important to find resources that work for you and to realize that people experience grief differently. There is not one grieving process that works for everyone in the same way.  Be aware that when grief becomes extreme, it can lead to more severe issues like isolation, depression, anxiety, or suicidal thoughts. Talk with your health care provider if you have any of these issues. This information is not intended to replace advice given to you by your health care provider. Make sure you discuss any  questions you have with your health care provider. Document Revised: 11/19/2018 Document Reviewed: 01/29/2017 Elsevier Patient Education  [place], Adult After being diagnosed with an anxiety disorder, you may be relieved to know why you have felt or behaved a certain way. You may also feel overwhelmed about the treatment ahead and what it will mean for your life. With care and support, you can manage this condition and recover from it. How to manage lifestyle changes Managing stress and anxiety  Stress is your body's reaction to life changes and events, both good and bad. Most stress will last just a few hours, but stress can be ongoing and can lead to more than just stress. Although stress can play a major role in anxiety, it is not the same as anxiety. Stress is usually caused by something external, such as a deadline, test, or competition. Stress normally passes after the triggering event has ended.  Anxiety is caused by something internal, such as imagining a terrible outcome or worrying that something will go wrong that will devastate you. Anxiety often does not go away even after the triggering event is over, and it can become long-term (chronic) worry. It is important to understand the differences between stress and anxiety and to manage your stress effectively so that it does not lead to an anxious response. Talk with your health care provider or a counselor to learn more about  reducing anxiety and stress. He or she may suggest tension reduction techniques, such as:  Music therapy. This can include creating or listening to music that you enjoy and that inspires you.  Mindfulness-based meditation. This involves being aware of your normal breaths while not trying to control your breathing. It can be done while sitting or walking.  Centering prayer. This involves focusing on a word, phrase, or sacred image that means something to you and brings you peace.  Deep  breathing. To do this, expand your stomach and inhale slowly through your nose. Hold your breath for 3-5 seconds. Then exhale slowly, letting your stomach muscles relax.  Self-talk. This involves identifying thought patterns that lead to anxiety reactions and changing those patterns.  Muscle relaxation. This involves tensing muscles and then relaxing them. Choose a tension reduction technique that suits your lifestyle and personality. These techniques take time and practice. Set aside 5-15 minutes a day to do them. Therapists can offer counseling and training in these techniques. The training to help with anxiety may be covered by some insurance plans. Other things you can do to manage stress and anxiety include:  Keeping a stress/anxiety diary. This can help you learn what triggers your reaction and then learn ways to manage your response.  Thinking about how you react to certain situations. You may not be able to control everything, but you can control your response.  Making time for activities that help you relax and not feeling guilty about spending your time in this way.  Visual imagery and yoga can help you stay calm and relax.  Medicines Medicines can help ease symptoms. Medicines for anxiety include:  Anti-anxiety drugs.  Antidepressants. Medicines are often used as a primary treatment for anxiety disorder. Medicines will be prescribed by a health care provider. When used together, medicines, psychotherapy, and tension reduction techniques may be the most effective treatment. Relationships Relationships can play a big part in helping you recover. Try to spend more time connecting with trusted friends and family members. Consider going to couples counseling, taking family education classes, or going to family therapy. Therapy can help you and others better understand your condition. How to recognize changes in your anxiety Everyone responds differently to treatment for anxiety.  Recovery from anxiety happens when symptoms decrease and stop interfering with your daily activities at home or work. This may mean that you will start to:  Have better concentration and focus. Worry will interfere less in your daily thinking.  Sleep better.  Be less irritable.  Have more energy.  Have improved memory. It is important to recognize when your condition is getting worse. Contact your health care provider if your symptoms interfere with home or work and you feel like your condition is not improving. Follow these instructions at home: Activity  Exercise. Most adults should do the following: ? Exercise for at least 150 minutes each week. The exercise should increase your heart rate and make you sweat (moderate-intensity exercise). ? Strengthening exercises at least twice a week.  Get the right amount and quality of sleep. Most adults need 7-9 hours of sleep each night. Lifestyle   Eat a healthy diet that includes plenty of vegetables, fruits, whole grains, low-fat dairy products, and lean protein. Do not eat a lot of foods that are high in solid fats, added sugars, or salt.  Make choices that simplify your life.  Do not use any products that contain nicotine or tobacco, such as cigarettes, e-cigarettes, and chewing tobacco. If  you need help quitting, ask your health care provider.  Avoid caffeine, alcohol, and certain over-the-counter cold medicines. These may make you feel worse. Ask your pharmacist which medicines to avoid. General instructions  Take over-the-counter and prescription medicines only as told by your health care provider.  Keep all follow-up visits as told by your health care provider. This is important. Where to find support You can get help and support from these sources:  Self-help groups.  Online and [employer].  A trusted spiritual leader.  Couples counseling.  Family education classes.  Family therapy. Where to find more  information You may find that joining a support group helps you deal with your anxiety. The following sources can help you locate counselors or support groups near you:  [place]: www.mentalhealthamerica.net  Anxiety and Depression Association of [place] (ADAA): https://www.clark.net/  National Alliance on Mental Illness (NAMI): www.nami.org Contact a health care provider if you:  Have a hard time staying focused or finishing daily tasks.  Spend many hours a day feeling worried about everyday life.  Become exhausted by worry.  Start to have headaches, feel tense, or have nausea.  Urinate more than normal.  Have diarrhea. Get help right away if you have:  A racing heart and shortness of breath.  Thoughts of hurting yourself or others. If you ever feel like you may hurt yourself or others, or have thoughts about taking your own life, get help right away. You can go to your nearest emergency department or call:  Your local emergency services (911 in the [place]  A suicide crisis helpline, such as the [place] at (541) 540-9454. This is open 24 hours a day. Summary  Taking steps to learn and use tension reduction techniques can help calm you and help prevent triggering an anxiety reaction.  When used together, medicines, psychotherapy, and tension reduction techniques may be the most effective treatment.  Family, friends, and partners can play a big part in helping you recover from an anxiety disorder. This information is not intended to replace advice given to you by your health care provider. Make sure you discuss any questions you have with your health care provider. Document Revised: 02/15/2019 Document Reviewed: 02/15/2019 Elsevier Patient Education  [place].  Escitalopram tablets What is this medicine? ESCITALOPRAM (es sye TAL oh pram) is used to treat depression and certain types of anxiety. This medicine may be used for other  purposes; ask your health care provider or pharmacist if you have questions. COMMON BRAND NAME(S): Lexapro What should I tell my health care provider before I take this medicine? They need to know if you have any of these conditions:  bipolar disorder or a family history of bipolar disorder  diabetes  glaucoma  heart disease  kidney or liver disease  receiving electroconvulsive therapy  seizures (convulsions)  suicidal thoughts, plans, or attempt by you or a family member  an unusual or allergic reaction to escitalopram, the related drug citalopram, other medicines, foods, dyes, or preservatives  pregnant or trying to become pregnant  breast-feeding How should I use this medicine? Take this medicine by mouth with a glass of water. Follow the directions on the prescription label. You can take it with or without food. If it upsets your stomach, take it with food. Take your medicine at regular intervals. Do not take it more often than directed. Do not stop taking this medicine suddenly except upon the advice of your doctor. Stopping this medicine too quickly  may cause serious side effects or your condition may worsen. A special MedGuide will be given to you by the pharmacist with each prescription and refill. Be sure to read this information carefully each time. Talk to your pediatrician regarding the use of this medicine in children. Special care may be needed. Overdosage: If you think you have taken too much of this medicine contact a poison control center or emergency room at once. NOTE: This medicine is only for you. Do not share this medicine with others. What if I miss a dose? If you miss a dose, take it as soon as you can. If it is almost time for your next dose, take only that dose. Do not take double or extra doses. What may interact with this medicine? Do not take this medicine with any of the following medications:  certain medicines for fungal infections like  fluconazole, itraconazole, ketoconazole, posaconazole, voriconazole  cisapride  citalopram  dronedarone  linezolid  MAOIs like Carbex, Eldepryl, Marplan, Nardil, and Parnate  methylene blue (injected into a vein)  pimozide  thioridazine This medicine may also interact with the following medications:  alcohol  amphetamines  aspirin and aspirin-like medicines  carbamazepine  certain medicines for depression, anxiety, or psychotic disturbances  certain medicines for migraine headache like almotriptan, eletriptan, frovatriptan, naratriptan, rizatriptan, sumatriptan, zolmitriptan  certain medicines for sleep  certain medicines that treat or prevent blood clots like warfarin, enoxaparin, dalteparin  cimetidine  diuretics  dofetilide  fentanyl  furazolidone  isoniazid  lithium  metoprolol  NSAIDs, medicines for pain and inflammation, like ibuprofen or naproxen  other medicines that prolong the QT interval (cause an abnormal heart rhythm)  procarbazine  rasagiline  supplements like St. John's wort, kava kava, valerian  tramadol  tryptophan  ziprasidone This list may not describe all possible interactions. Give your health care provider a list of all the medicines, herbs, non-prescription drugs, or dietary supplements you use. Also tell them if you smoke, drink alcohol, or use illegal drugs. Some items may interact with your medicine. What should I watch for while using this medicine? Tell your doctor if your symptoms do not get better or if they get worse. Visit your doctor or health care professional for regular checks on your progress. Because it may take several weeks to see the full effects of this medicine, it is important to continue your treatment as prescribed by your doctor. Patients and their families should watch out for new or worsening thoughts of suicide or depression. Also watch out for sudden changes in feelings such as feeling anxious,  agitated, panicky, irritable, hostile, aggressive, impulsive, severely restless, overly excited and hyperactive, or not being able to sleep. If this happens, especially at the beginning of treatment or after a change in dose, call your health care professional. Dennis Bast may get drowsy or dizzy. Do not drive, use machinery, or do anything that needs mental alertness until you know how this medicine affects you. Do not stand or sit up quickly, especially if you are an older patient. This reduces the risk of dizzy or fainting spells. Alcohol may interfere with the effect of this medicine. Avoid alcoholic drinks. Your mouth may get dry. Chewing sugarless gum or sucking hard candy, and drinking plenty of water may help. Contact your doctor if the problem does not go away or is severe. What side effects may I notice from receiving this medicine? Side effects that you should report to your doctor or health care professional as soon as possible:  allergic reactions like skin rash, itching or hives, swelling of the face, lips, or tongue  anxious  black, tarry stools  changes in vision  confusion  elevated mood, decreased need for sleep, racing thoughts, impulsive behavior  eye pain  fast, irregular heartbeat  feeling faint or lightheaded, falls  feeling agitated, angry, or irritable  hallucination, loss of contact with reality  loss of balance or coordination  loss of memory  painful or prolonged erections  restlessness, pacing, inability to keep still  seizures  stiff muscles  suicidal thoughts or other mood changes  trouble sleeping  unusual bleeding or bruising  unusually weak or tired  vomiting Side effects that usually do not require medical attention (report to your doctor or health care professional if they continue or are bothersome):  changes in appetite  change in sex drive or performance  headache  increased sweating  indigestion, nausea  tremors This list  may not describe all possible side effects. Call your doctor for medical advice about side effects. You may report side effects to FDA at 1-800-FDA-1088. Where should I keep my medicine? Keep out of reach of children. Store at room temperature between 15 and 30 degrees C (59 and 86 degrees F). Throw away any unused medicine after the expiration date. NOTE: This sheet is a summary. It may not cover all possible information. If you have questions about this medicine, talk to your doctor, pharmacist, or health care provider.  2020 Elsevier/Gold Standard (2018-09-06 11:21:44)

## 2020-04-11 NOTE — Progress Notes (Signed)
Sent for repeat culture

## 2020-04-13 ENCOUNTER — Other Ambulatory Visit: Payer: Self-pay | Admitting: Adult Health

## 2020-04-13 DIAGNOSIS — N390 Urinary tract infection, site not specified: Secondary | ICD-10-CM | POA: Insufficient documentation

## 2020-04-13 DIAGNOSIS — R319 Hematuria, unspecified: Secondary | ICD-10-CM

## 2020-04-13 MED ORDER — DOXYCYCLINE HYCLATE 100 MG PO TABS: 100.0000 mg | ORAL_TABLET | Freq: Two times a day (BID) | ORAL | 0 refills | Status: DC

## 2020-04-13 NOTE — Progress Notes (Signed)
Patient still has e coli and had blood in urine, I have sent in Doxycycline as below  can cause GI upset - take with food. Sun sensitivity,. Do not lay down flat within 30 minutes of taking medication. I also advise her to follow up with Hollice Espy MD given her repeat infections and history.  If antibiotic needs to be changed due to sensitivity result we will contact her.

## 2020-04-13 NOTE — Progress Notes (Signed)
Meds ordered this encounter  Medications  . doxycycline (VIBRA-TABS) 100 MG tablet    Sig: Take 1 tablet (100 mg total) by mouth 2 (two) times daily.    Dispense:  20 tablet    Refill:  0   Urinary tract infection with hematuria, site unspecified - Plan: doxycycline (VIBRA-TABS) 100 MG tablet

## 2020-04-14 LAB — URINE CULTURE

## 2020-04-16 ENCOUNTER — Telehealth: Payer: Self-pay

## 2020-04-16 ENCOUNTER — Ambulatory Visit: Payer: 59

## 2020-04-16 NOTE — Telephone Encounter (Signed)
Patient has been advised. KW 

## 2020-04-16 NOTE — Telephone Encounter (Signed)
-----   Message from Doreen Beam, [place] sent at 04/16/2020 12:00 PM EDT -----  Doreen Beam, FNP  04/13/2020 8:20 AM EDT Back to Top  Patient still has e coli and had blood in urine, I have sent in Doxycycline as below can cause GI upset - take with food. Sun sensitivity,. Do not lay down flat within 30 minutes of taking medication. I also advise her to follow up with Hollice Espy MD given her repeat infections and history.  If antibiotic needs to be changed due to sensitivity result we will contact her.

## 2020-04-16 NOTE — Progress Notes (Signed)
[place], FNP  04/13/2020 8:20 AM EDT Back to Top  Patient still has e coli and had blood in urine, I have sent in Doxycycline as below can cause GI upset - take with food. Sun sensitivity,. Do not lay down flat within 30 minutes of taking medication. I also advise her to follow up with Hollice Espy MD given her repeat infections and history.  If antibiotic needs to be changed due to sensitivity result we will contact her.

## 2020-04-20 LAB — TSH: TSH: 2.45 u[IU]/mL (ref 0.450–4.500)

## 2020-04-20 LAB — COMPREHENSIVE METABOLIC PANEL
ALT: 17 IU/L (ref 0–32)
AST: 19 IU/L (ref 0–40)
Albumin/Globulin Ratio: 2 (ref 1.2–2.2)
Albumin: 4.5 g/dL (ref 3.8–4.9)
Alkaline Phosphatase: 61 IU/L (ref 48–121)
BUN/Creatinine Ratio: 14 (ref 9–23)
BUN: 13 mg/dL (ref 6–24)
Bilirubin Total: 0.4 mg/dL (ref 0.0–1.2)
CO2: 23 mmol/L (ref 20–29)
Calcium: 9.7 mg/dL (ref 8.7–10.2)
Chloride: 102 mmol/L (ref 96–106)
Creatinine, Ser: 0.9 mg/dL (ref 0.57–1.00)
GFR calc Af Amer: 81 mL/min/{1.73_m2} (ref >59)
GFR calc non Af Amer: 70 mL/min/{1.73_m2} (ref >59)
Globulin, Total: 2.2 g/dL (ref 1.5–4.5)
Glucose: 97 mg/dL (ref 65–99)
Potassium: 4.7 mmol/L (ref 3.5–5.2)
Sodium: 140 mmol/L (ref 134–144)
Total Protein: 6.7 g/dL (ref 6.0–8.5)

## 2020-04-20 LAB — LIPID PANEL
Chol/HDL Ratio: 3.3 ratio (ref 0.0–4.4)
Cholesterol, Total: 157 mg/dL (ref 100–199)
HDL: 47 mg/dL (ref >39)
LDL Chol Calc (NIH): 96 mg/dL (ref 0–99)
Triglycerides: 72 mg/dL (ref 0–149)
VLDL Cholesterol Cal: 14 mg/dL (ref 5–40)

## 2020-04-20 LAB — CBC WITH DIFFERENTIAL/PLATELET
Basophils Absolute: 0.1 10*3/uL (ref 0.0–0.2)
Basos: 1 %
EOS (ABSOLUTE): 0.1 10*3/uL (ref 0.0–0.4)
Eos: 2 %
Hematocrit: 37.8 % (ref 34.0–46.6)
Hemoglobin: 13 g/dL (ref 11.1–15.9)
Immature Grans (Abs): 0 10*3/uL (ref 0.0–0.1)
Immature Granulocytes: 0 %
Lymphocytes Absolute: 2.1 10*3/uL (ref 0.7–3.1)
Lymphs: 32 %
MCH: 33 pg (ref 26.6–33.0)
MCHC: 34.4 g/dL (ref 31.5–35.7)
MCV: 96 fL (ref 79–97)
Monocytes Absolute: 0.5 10*3/uL (ref 0.1–0.9)
Monocytes: 8 %
Neutrophils Absolute: 3.8 10*3/uL (ref 1.4–7.0)
Neutrophils: 57 %
Platelets: 244 10*3/uL (ref 150–450)
RBC: 3.94 x10E6/uL (ref 3.77–5.28)
RDW: 11.3 % — ABNORMAL LOW (ref 11.7–15.4)
WBC: 6.6 10*3/uL (ref 3.4–10.8)

## 2020-04-20 LAB — VITAMIN D 25 HYDROXY (VIT D DEFICIENCY, FRACTURES): Vit D, 25-Hydroxy: 73.5 ng/mL (ref 30.0–100.0)

## 2020-04-20 NOTE — Progress Notes (Signed)
CBC within normal limits no anemia, or signs infection.CMP within kidney, liver function and electrolytes. TSH for thyroid within normal limits. Cholesterol within normal limits. Vitamin D within normal limits.

## 2020-04-25 ENCOUNTER — Telehealth: Payer: Self-pay

## 2020-04-25 ENCOUNTER — Ambulatory Visit: Payer: 59 | Attending: Adult Health

## 2020-04-25 NOTE — Telephone Encounter (Signed)
Copied from [place] [id number]. Topic: General - Other >> Apr 25, 2020  2:51 PM Jaynie Collins D wrote: Reason for CRM: Ultrasound department wanted to make a courtesy call, to let practice know that patient did not show up for appointment today.

## 2020-04-25 NOTE — Telephone Encounter (Signed)
Noted . No show for [place].

## 2020-05-03 ENCOUNTER — Ambulatory Visit: Payer: Self-pay | Admitting: *Deleted

## 2020-05-03 NOTE — Telephone Encounter (Signed)
Please advise 

## 2020-05-03 NOTE — Telephone Encounter (Signed)
Patient was prescribed antibiotic which causes diarrhea and wanted to know if there was an alternative that doesn't cause diarrhea due to work.   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 Patient did not pick up medication when she learned that it could cause diarrhea. She is requesting an alternative that does not have that SE. Patient advised - would send request to PCP. Reason for Disposition . [1] Caller has NON-URGENT medicine question about med that PCP prescribed AND [2] triager unable to answer question    Patient requesting alternative Rx  Answer Assessment - Initial Assessment Questions 1. NAME of MEDICATION: "What medicine are you calling about?"     Doxycycline 2. QUESTION: "What is your question?" (e.g., medication refill, side effect)     Patient did not pick up due to SE- diarrhea- she works in [employer] 3. PRESCRIBING HCP: "Who prescribed it?" Reason: if prescribed by specialist, call should be referred to that group.     PCP  Protocols used: MEDICATION QUESTION CALL-A-AH

## 2020-05-03 NOTE — Telephone Encounter (Signed)
Most all antibiotics have side effect of diarrhea. Recommend her taking it with food and not lying down within one hour of taking.   This is listed on all antibiotics as side effect.   Sounds like she has not taking the medication at all ?

## 2020-05-04 ENCOUNTER — Other Ambulatory Visit: Payer: Self-pay | Admitting: Adult Health

## 2020-05-04 DIAGNOSIS — F418 Other specified anxiety disorders: Secondary | ICD-10-CM

## 2020-05-04 NOTE — Telephone Encounter (Signed)
LMOVM for pt to return call 

## 2020-05-17 ENCOUNTER — Ambulatory Visit: Payer: Self-pay | Admitting: Adult Health

## 2020-05-21 ENCOUNTER — Ambulatory Visit: Payer: Self-pay | Admitting: Adult Health

## 2020-05-22 ENCOUNTER — Ambulatory Visit
Admission: RE | Admit: 2020-05-22 | Discharge: 2020-05-22 | Disposition: A | Discharge status: Home / Self Care | Payer: 59 | Source: Ambulatory Visit | Attending: Urology | Admitting: Urology

## 2020-05-22 ENCOUNTER — Other Ambulatory Visit: Payer: Self-pay

## 2020-05-22 DIAGNOSIS — N2889 Other specified disorders of kidney and ureter: Secondary | ICD-10-CM

## 2020-05-22 MED ORDER — GADOBUTROL 1 MMOL/ML IV SOLN
7.0000 mL | Freq: Once | INTRAVENOUS | Status: AC | PRN
  Administered 2020-05-22: 7 mL via INTRAVENOUS

## 2020-05-27 IMAGING — MG DIGITAL DIAGNOSTIC BILAT W/ TOMO W/ CAD
8 series · 8 of 24 positions shown · non-contrast
Comparison: Previous exam(s).

CLINICAL DATA: Follow-up of bilateral micro cysts.

EXAM:
DIGITAL DIAGNOSTIC bilateral MAMMOGRAM WITH CAD AND TOMO
ULTRASOUND right BREAST

[R MLO synth-2D]
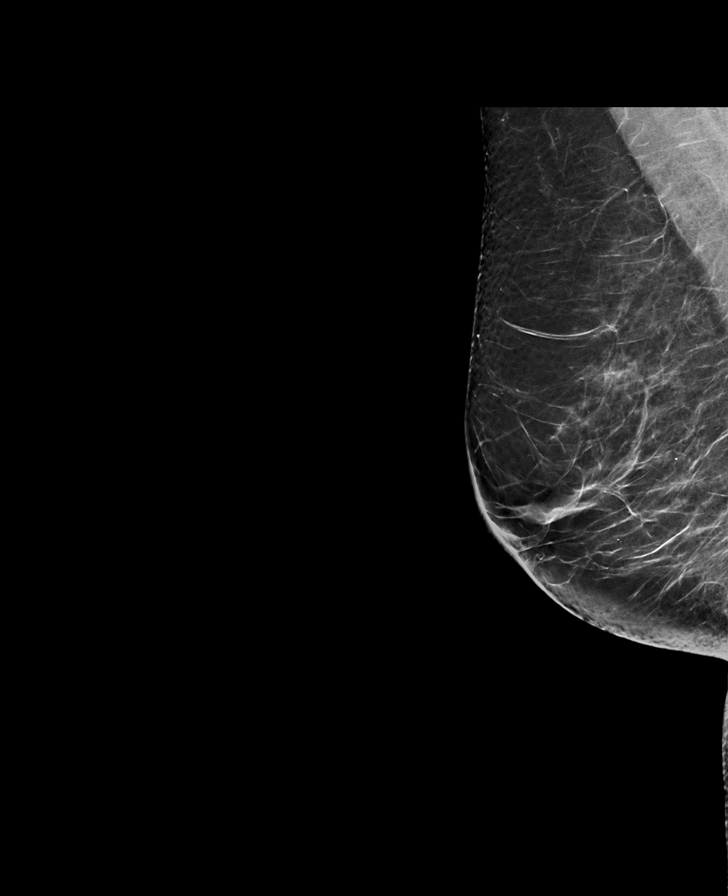

[R CC synth-2D]
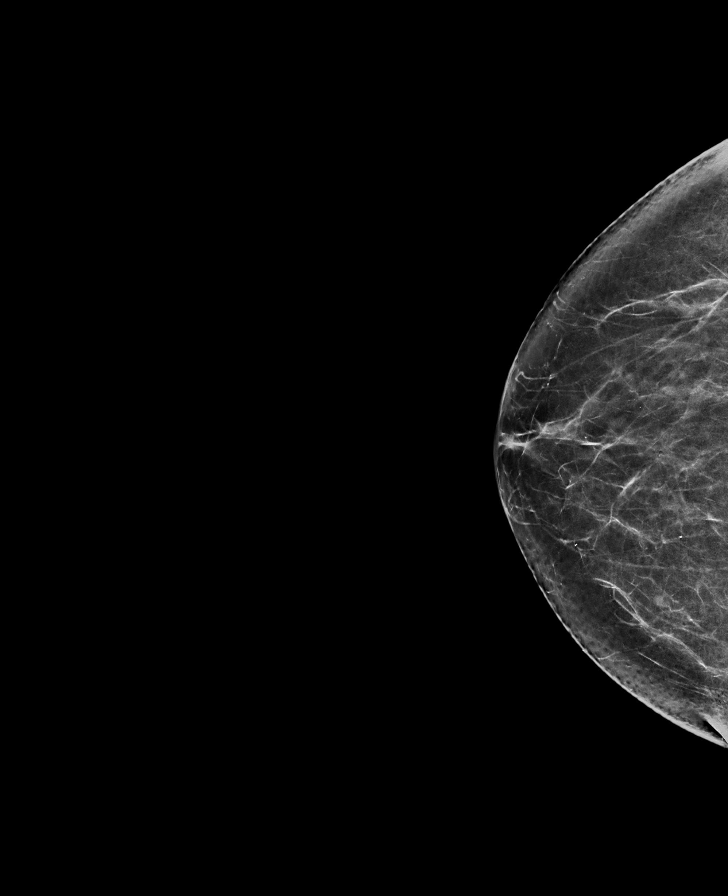

[L MLO synth-2D]
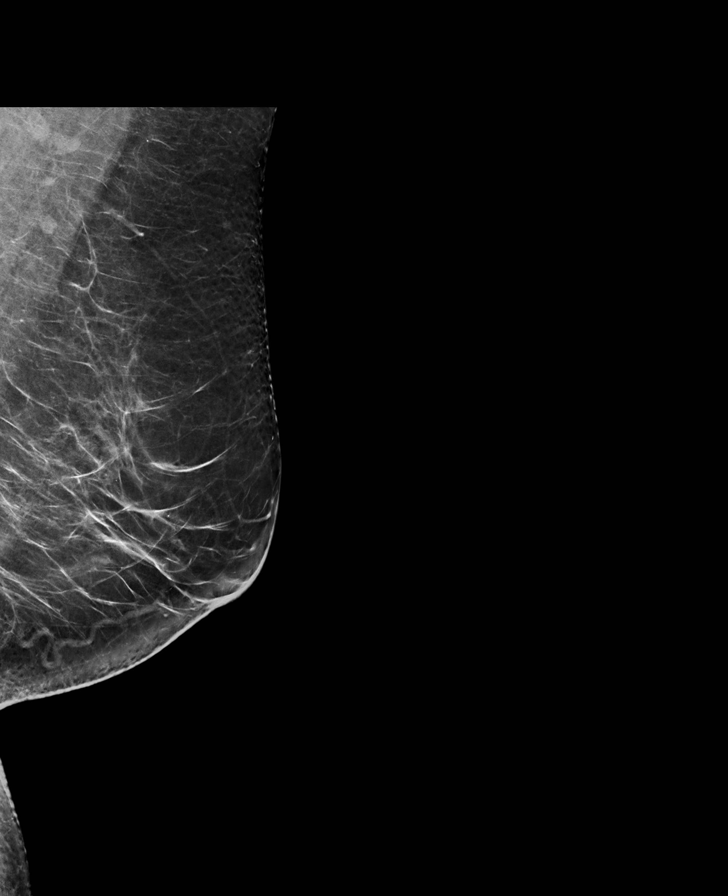

[L CC synth-2D]
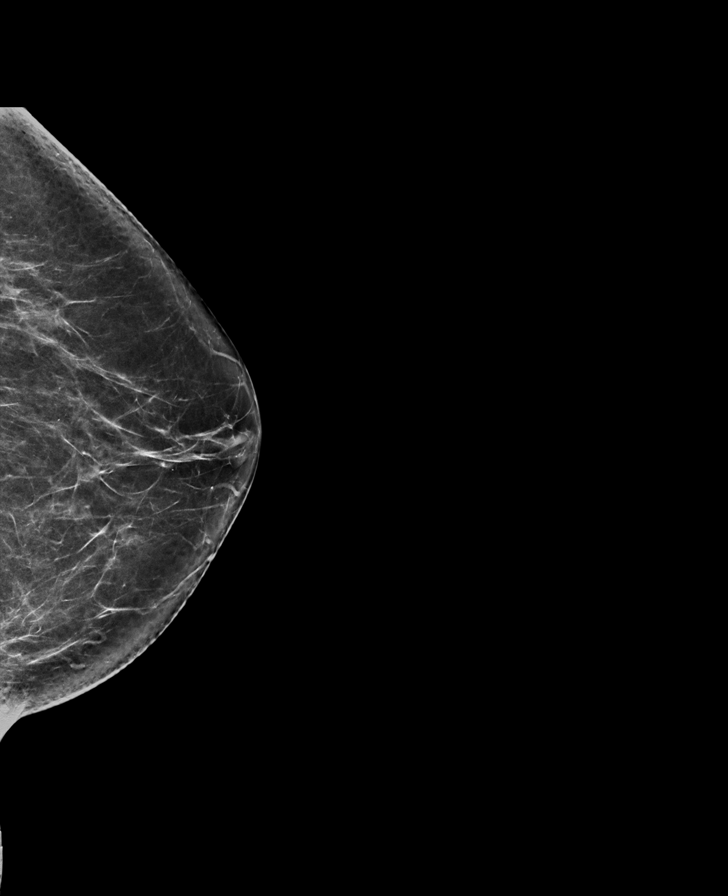

[R CC tomo · tomo slice 35/68.0]
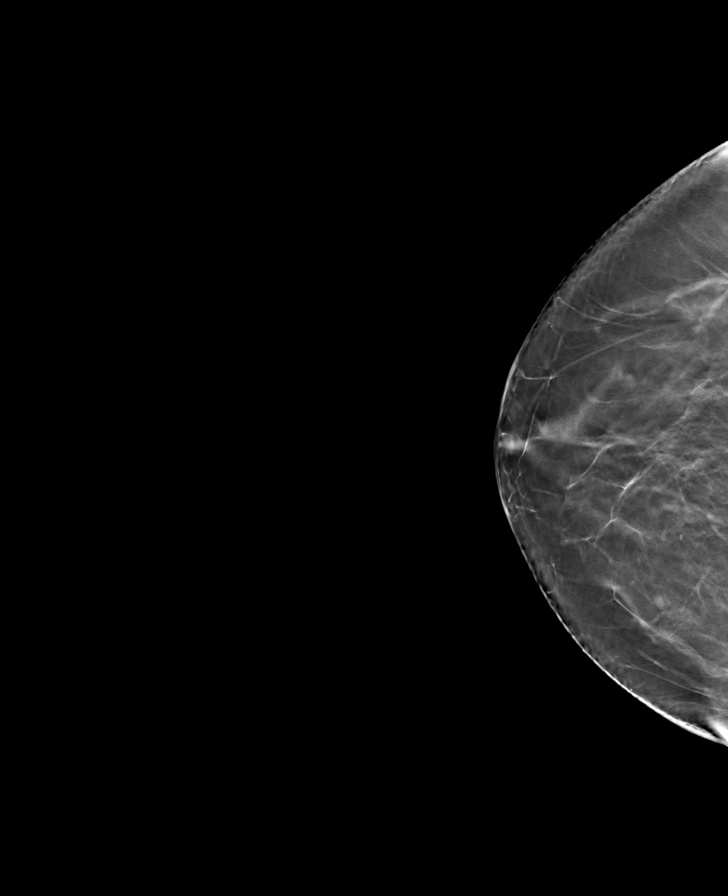

[L CC tomo · tomo slice 36/71.0]
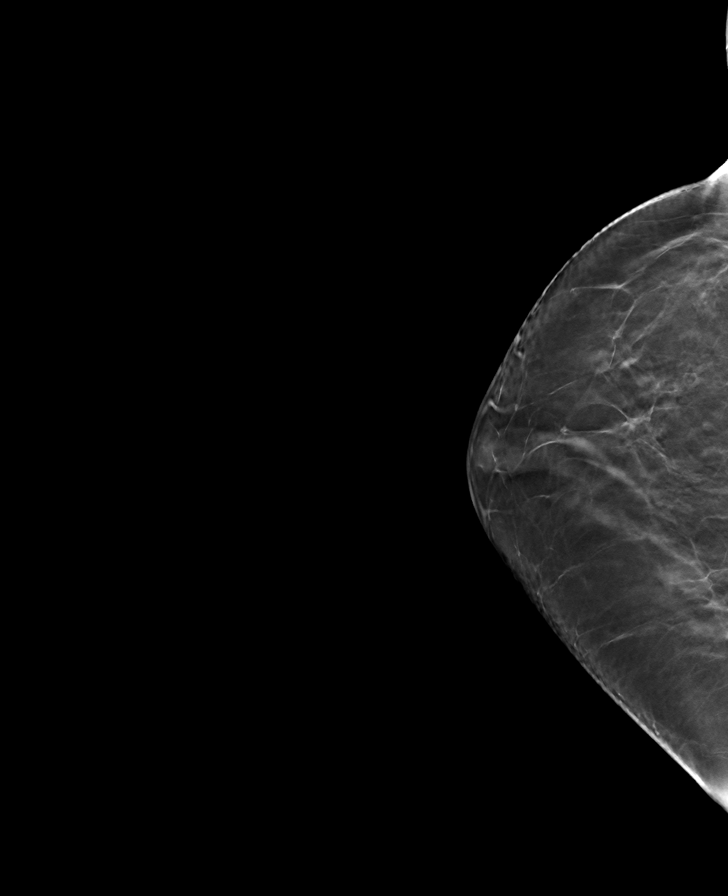

[L MLO tomo · tomo slice 39/78.0]
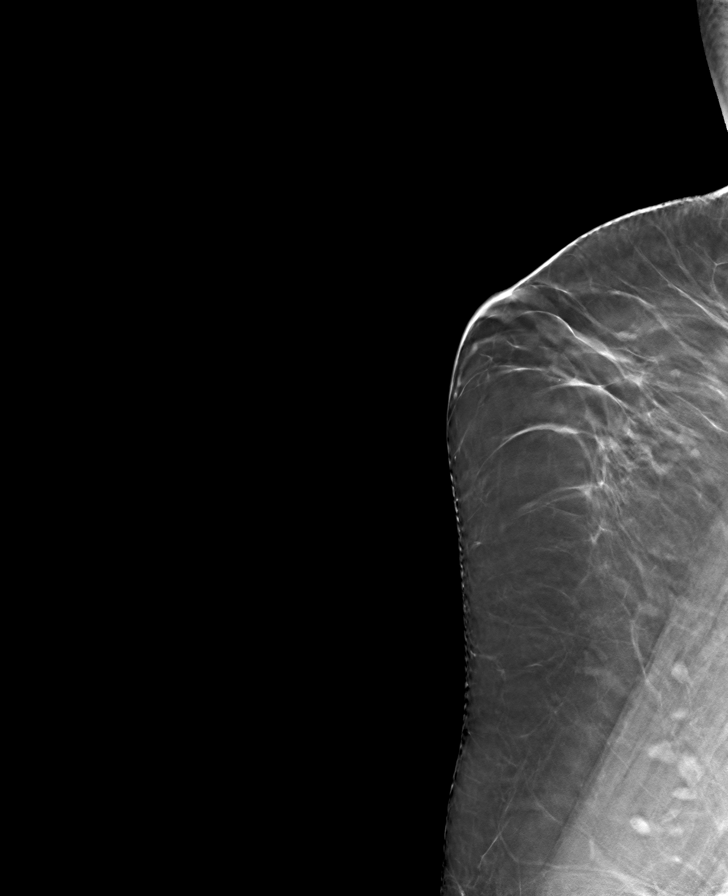

[R MLO tomo · tomo slice 38/75.0]
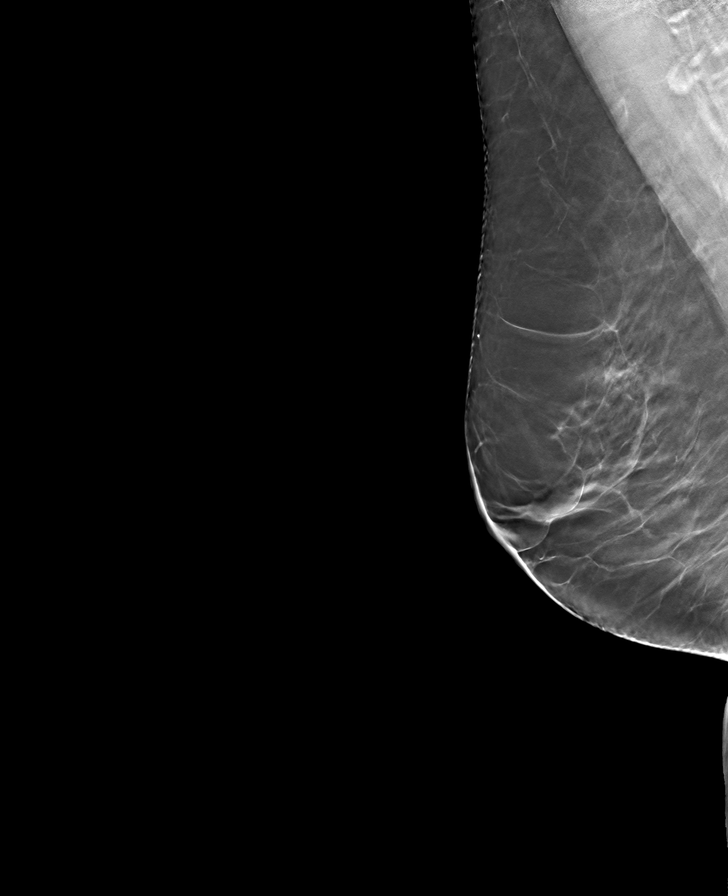

[8 of 24 positions shown; findings below may reference images not displayed]

ACR Breast Density Category b: There are scattered areas of
fibroglandular density.
FINDINGS: Cc and MLO views of bilateral breasts, exaggerated right cc views
are submitted. There is a small mass in the medial posterior right
breast. No other suspicious abnormalities identified in bilateral
breasts.

Mammographic images were processed with CAD.

Targeted ultrasound is performed, showing 3.5 mm simple cyst at the
right breast 3 o'clock 4 cm from nipple correlating to the
mammographic mass.
IMPRESSION: Benign findings.

RECOMMENDATION:
Routine screening mammogram in 1 year.

I have discussed the findings and recommendations with the patient.
If applicable, a reminder letter will be sent to the patient
regarding the next appointment.

BI-RADS CATEGORY  2: Benign.

## 2020-05-29 ENCOUNTER — Telehealth: Payer: Self-pay

## 2020-05-29 NOTE — Telephone Encounter (Signed)
Called patient to schedule appointment.  She states she will call back tomorrow to schedule she does not have her calender with her

## 2020-05-29 NOTE — Telephone Encounter (Signed)
-----   Message from Hollice Espy, MD sent at 05/29/2020  4:21 PM EDT ----- Please call this patient to schedule MRI follow up.  Virtual is fine if she is able to do that.    Hollice Espy, MD

## 2020-06-11 NOTE — Progress Notes (Signed)
Virtual Visit via Video Note  I connected with Kim Riley on 06/12/2020 at  4:00 PM EDT by a video enabled telemedicine application and verified that I am speaking with the correct person using two identifiers.  Location: Patient: Work Provider: Office   I discussed the limitations of evaluation and management by telemedicine and the availability of in person appointments. The patient expressed understanding and agreed to proceed.  History of Present Illness: Kim Riley is a 59 y.o. female who presents today for a 6 month follow up.   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   This directly compared to CT scan from 06/19/18 at which time no solid right sided renal mass appreciated and also compared to multiple MRI reports from [facility] dating back to 2015.   She used to have MRI done once a year, last 2019.  At last visit, she was living a healthy lifestyle including stopping fast food. She was interested in vaccination but has no information regarding it. She had continued to work but was extremely anxious about COVID19.    Abdominal MRI on 05/22/20 revealed a 1.1 cm exophytic nodular "lesion" at the extreme upper pole right kidney as suggested on recent ultrasound exam. Imaging features most likely related to adjacent focal scarring creating an exophytic appearance to normal renal parenchyma. The finding is unchanged since MRI of 2015 consistent with benign etiology. A 1.8 x 1.8 cm exophytic, heterogeneously enhancing lesion from the medial lip of the interpolar left kidney is stable in size in the interval since 2015 MRI. While imaging appearance raises concern for renal cell carcinoma, the interval stability suggests that this  may be benign. 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 Small bilateral renal cysts  Patient doing well. She asked about her stones. Denies any pain.   Observations/Objective: Patient is engaged and asking good questions.   Pertinent imaging: CLINICAL DATA:  11 mm solid nodule in the upper pole right kidney on recent ultrasound.  EXAM: MRI ABDOMEN WITHOUT AND WITH CONTRAST  TECHNIQUE: Multiplanar multisequence MR imaging of the abdomen was performed both before and after the administration of intravenous contrast.  CONTRAST:  10mL GADAVIST GADOBUTROL 1 MMOL/ML IV SOLN  COMPARISON:  Ultrasound exam 11/24/2019. CT scan 06/19/2018. MRI abdomen 05/23/2014  FINDINGS: Lower chest: Unremarkable.  Hepatobiliary: No suspicious focal abnormality within the liver parenchyma. There is no evidence for gallstones, gallbladder wall thickening, or pericholecystic fluid. No intrahepatic or extrahepatic biliary dilation.  Pancreas: No focal mass lesion. No dilatation of the main duct. No intraparenchymal cyst. No peripancreatic edema.  Spleen:  No splenomegaly. No focal mass lesion.  Adrenals/Urinary Tract:  No adrenal nodule or mass.  1.1 cm exophytic nodular lesion identified extreme upper pole right kidney as suggested on recent ultrasound exam. This structure mimics background renal parenchymal signal intensity on all pulse sequences and shows no differential enhancement after IV contrast administration. No evidence for restricted diffusion. This appears to be a exophytic appearing component of normal renal parenchyma, likely accentuated by adjacent focal areas of scarring. Appearance is unchanged comparing back to MRI of 05/23/2014. 9 mm simple cyst noted lower pole right kidney.  1.8 x 1.8 cm T1 isointense, T2 isointense, exophytic, heterogeneously enhancing lesion is identified from the medial lip  of the interpolar left kidney (well demonstrated on T2 imaging  and axial 23 second postcontrast image 52 of series 13. This lesion measures 1.8 x 1.8 cm on T2 axial imaging today which compares to 2.1 x 1.9 cm when I remeasure in a similar fashion on the T2 axial images from the study of 05/23/2014. While appearance raises concern for renal cell carcinoma, the 6 years of stability suggest a benign etiology.  1.3 cm lateral interpolar left renal angiomyolipoma is stable from 1.2 cm on the 05/23/2014 MRI. 1.5 cm Bosniak I cyst identified posterior interpolar left kidney while a 1.2 cm Bosniak II cyst is identified more laterally in the interpolar left kidney  Stomach/Bowel: Stomach is unremarkable. No gastric wall thickening. No evidence of outlet obstruction. Duodenum is normally positioned as is the ligament of Treitz. No small bowel or colonic dilatation within the visualized abdomen.  Vascular/Lymphatic: No abdominal aortic aneurysm. There is no gastrohepatic or hepatoduodenal ligament lymphadenopathy. No retroperitoneal or mesenteric lymphadenopathy.  Other:  No intraperitoneal free fluid.  Musculoskeletal: No focal suspicious marrow enhancement within the visualized bony anatomy.  IMPRESSION: 1. 1.1 cm exophytic nodular "lesion" at the extreme upper pole right kidney as suggested on recent ultrasound exam. Imaging features most likely related to adjacent focal scarring creating an exophytic appearance to normal renal parenchyma. The finding is unchanged since MRI of 2015 consistent with benign etiology. 2. 1.8 x 1.8 cm exophytic, heterogeneously enhancing lesion from the medial lip of the interpolar left kidney is stable in size in the interval since 2015 MRI. While imaging appearance raises concern for renal cell carcinoma, the interval stability suggests that this may be benign. 3. 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 4. Small bilateral renal cysts.   Electronically Signed   By: Misty Stanley M.D.   On:  05/22/2020 14:43  I have personally reviewed the images and agree with radiologist interpretation.     Assessment and Plan:  1. Right upper pole renal mass Consistent with normal kidney tissue No further follow up needed.   2. Left renal mass Stable 1.8 cm lesion since 2015. Suggested to be benign but will continue surveillance. Repeat renal US in 1 year and continue surveillance annually.   3. Left AML Stable  Will continue surveillance with above.  4. History of nephrolithiasis Asymptomatic Pt had a 5 mm stone nonobstructing in the right inferior pole in 2019. Will follow up with RUS  Follow Up Instructions: F/u 1 year with RUS   I discussed the assessment and treatment plan with the patient. The patient was provided an opportunity to ask questions and all were answered. The patient agreed with the plan and demonstrated an understanding of the instructions.   The patient was advised to call back or seek an in-person evaluation if the symptoms worsen or if the condition fails to improve as anticipated.  I provided 10 minutes of non-face-to-face time during this encounter.   Fransico Him, am acting as a scribe for Dr. Hollice Espy.  I have reviewed the above documentation for accuracy and completeness, and I agree with the above.   Hollice Espy, MD

## 2020-06-12 ENCOUNTER — Telehealth (INDEPENDENT_AMBULATORY_CARE_PROVIDER_SITE_OTHER): Payer: 59 | Admitting: Urology

## 2020-06-12 ENCOUNTER — Other Ambulatory Visit: Payer: Self-pay

## 2020-06-12 DIAGNOSIS — N2889 Other specified disorders of kidney and ureter: Secondary | ICD-10-CM

## 2020-06-12 NOTE — Progress Notes (Signed)
This service is provided via telemedicine   No vital signs collected/recorded due to the encounter was a telemedicine visit.     Patient consents to a telephone visit:  yes    Names of all persons participating in the telemedicine service and their role in the encounter:  Aryannah Mohon, CMA and Ashley Brandon, MD   

## 2020-06-18 ENCOUNTER — Other Ambulatory Visit: Payer: Self-pay | Admitting: Neurology

## 2020-06-18 DIAGNOSIS — H9313 Tinnitus, bilateral: Secondary | ICD-10-CM

## 2020-07-19 ENCOUNTER — Telehealth: Payer: Self-pay | Admitting: Adult Health

## 2020-07-19 NOTE — Telephone Encounter (Signed)
Spoke with patient on the phone and advised her of your response below she states that her Urologist told her that they thought she might have cancer but numerous MRI have ruled it out patient states. Patient says MRI showed that it was benign fatty tissue. Patient states that she has been monitored by urology for this.KW

## 2020-07-19 NOTE — Telephone Encounter (Signed)
This was entered in patients chart in 2015 after review, looks like in care everywhere she was seen at [facility] hematology. Has she has follow up or PET scan they ordered.

## 2020-07-19 NOTE — Telephone Encounter (Signed)
Pt went to a Neurologist and on her after visit summary from them it states she has Cancer of the kidney / Pt states she doesn't have that and wanted to make sure that is not in her records at this office / please advise    Pt doesn't want this to interfere with her application for life insurance

## 2020-07-19 NOTE — Telephone Encounter (Signed)
Please advise this is active on her problem list. Kim Riley

## 2020-07-20 NOTE — Telephone Encounter (Signed)
Please have her call urology to discuss removing this diagnosis if applicable or any other recommended work up, as it has been in chart since 2015 we did not add.

## 2020-07-20 NOTE — Telephone Encounter (Signed)
Patient states that she will speak with urologist. Kim Riley

## 2020-08-01 ENCOUNTER — Ambulatory Visit: Payer: 59 | Admitting: Dermatology

## 2020-08-02 ENCOUNTER — Ambulatory Visit
Admission: RE | Admit: 2020-08-02 | Discharge: 2020-08-02 | Disposition: A | Discharge status: Home / Self Care | Payer: 59 | Source: Ambulatory Visit | Attending: Neurology | Admitting: Neurology

## 2020-08-02 ENCOUNTER — Other Ambulatory Visit: Payer: Self-pay

## 2020-08-02 ENCOUNTER — Encounter: Payer: Self-pay | Admitting: Podiatry

## 2020-08-02 ENCOUNTER — Ambulatory Visit (INDEPENDENT_AMBULATORY_CARE_PROVIDER_SITE_OTHER): Payer: 59 | Admitting: Podiatry

## 2020-08-02 DIAGNOSIS — R52 Pain, unspecified: Secondary | ICD-10-CM

## 2020-08-02 DIAGNOSIS — H9313 Tinnitus, bilateral: Secondary | ICD-10-CM | POA: Diagnosis present

## 2020-08-02 DIAGNOSIS — L603 Nail dystrophy: Secondary | ICD-10-CM | POA: Diagnosis not present

## 2020-08-02 MED ORDER — GADOBUTROL 1 MMOL/ML IV SOLN
7.5000 mL | Freq: Once | INTRAVENOUS | Status: AC | PRN
  Administered 2020-08-02: 7.5 mL via INTRAVENOUS

## 2020-08-02 NOTE — Progress Notes (Signed)
This patient presents the office with chief complaint of a pain on the inside of her right big toe.  She says there is also  pain along the outside border right big toe.  She denies any redness  swelling or drainage from the big toe right foot.  The medial border was corrected surgically  months ago by myself.  She presents to the office for evaluation and treatment.    Vascular  Dorsalis pedis and posterior tibial pulses are palpable  B/L.  Capillary return  WNL.  Temperature gradient is  WNL.  Skin turgor  WNL  Sensorium  Senn Weinstein monofilament wire  WNL. Normal tactile sensation.  Nail Exam  Patient has normal nails with no evidence of bacterial or fungal infection. Absence of nail border medial aspect right hallux.    No redness or swelling noted.    Orthopedic  Exam  Muscle tone and muscle strength  WNL.  No limitations of motion feet  B/L.  No crepitus or joint effusion noted.  Foot type is unremarkable and digits show no abnormalities.  Bony prominences are unremarkable.  Skin  No open lesions.  Normal skin texture and turgor.  Nail dystrophy.  ROV.  Discussed this condition with patient.  Used my dremel tool and rasped her nail borders right big toe.  She left stating her pain had resolved.  Told her to use chapstick to moisturize medial border right hallux.     Gardiner Barefoot DPM

## 2020-11-15 ENCOUNTER — Ambulatory Visit: Payer: 59 | Admitting: Dermatology

## 2021-03-01 ENCOUNTER — Telehealth: Payer: Self-pay

## 2021-03-01 NOTE — Telephone Encounter (Signed)
Copied from [place] [id number]. Topic: Appointment Scheduling - Scheduling Inquiry for Clinic >> Feb 28, 2021  3:17 PM Valere Dross wrote: Reason for CRM: Patient called in about wanting to a PAP and CPE, pt wanted to see a female Provider due to personal issues. Pt has request specifically 08/11, attempted to get pt scheduled with Dr. B but she did not have anything until January. Pt was okay with seeing a female provider for a CPE but not for a PAP. Due to billing issues this may cause for pt requesting to have office look at other options. Please advise.

## 2021-03-01 NOTE — Telephone Encounter (Signed)
LMTCB 03/01/2021  It looks like this pt is scheduled with Simona Huh 05/09/2021 @9am  AND Dr. Jacinto Reap 05/09/2021 at 8:20.  Dr. B is not doing procedures at this time (Including Paps)  PEC please find out which appointment she would like to keep or if she needed a different day.   Thanks,   -Mickel Baas

## 2021-03-05 NOTE — Telephone Encounter (Signed)
LMTCB 03/05/2021.  PEC please advise as below.   Thanks,   -Mickel Baas

## 2021-03-06 NOTE — Telephone Encounter (Signed)
Attempted to call patient regarding appointment- left message to call office on voicemail.

## 2021-05-09 ENCOUNTER — Encounter: Payer: 59 | Admitting: Family Medicine

## 2021-05-09 ENCOUNTER — Ambulatory Visit: Payer: 59 | Admitting: Family Medicine

## 2021-06-06 ENCOUNTER — Ambulatory Visit
Admission: RE | Admit: 2021-06-06 | Discharge: 2021-06-06 | Disposition: A | Discharge status: Home / Self Care | Payer: 59 | Source: Ambulatory Visit | Attending: Urology | Admitting: Urology

## 2021-06-06 ENCOUNTER — Other Ambulatory Visit: Payer: Self-pay

## 2021-06-06 DIAGNOSIS — N2889 Other specified disorders of kidney and ureter: Secondary | ICD-10-CM | POA: Diagnosis present

## 2021-06-18 ENCOUNTER — Ambulatory Visit: Payer: 59 | Admitting: Urology

## 2021-07-03 NOTE — Progress Notes (Signed)
07/04/21 10:22 AM   Kim Riley 10/25/1960 [id number]  Referring provider:  Doreen Beam, FNP [address],  [place] [address] Chief Complaint  Patient presents with   Follow-up      HPI: Kim Riley is a 60 y.o.female with a personal history of recurrent stone disease, who returns today for 1 year f/u with RUS.   See previous notes for details she has a history of bilateral renal lesion and has had multiple imaging suggestive of probable benign etiology,.  Her most recent RUS on 06/06/2021 revealed no interval change in the appearance of the bilateral renal lesion compared to prior RUS.   She is doing well today. She reports that when she has not drank water she experiences a squeezing pain in her kidney, she believes that this has been her passing stones but has not experienced pain. She says she does not have immediate access to water during work.    PMH: Past Medical History:  Diagnosis Date   Allergy    Anxiety    Cardiomyopathy ([place])    Hypertension    Kidney stone    PONV (postoperative nausea and vomiting)    HARD TO WAKE UP 20 YEARS AGO FOR KIDNEY STONE-NO PROBLEM WITH 12-22-16 SURGERY    Surgical History: Past Surgical History:  Procedure Laterality Date   APPENDECTOMY     CYSTOSCOPY W/ RETROGRADES Right 12/22/2016   Procedure: CYSTOSCOPY WITH RETROGRADE PYELOGRAM;  Surgeon: Hollice Espy, MD;  Location: [facility];  Service: Urology;  Laterality: Right;   CYSTOSCOPY W/ URETERAL STENT PLACEMENT Right 01/07/2017   Procedure: CYSTOSCOPY WITH STENT REPLACEMENT;  Surgeon: Hollice Espy, MD;  Location: [facility];  Service: Urology;  Laterality: Right;   CYSTOSCOPY WITH STENT PLACEMENT Right 12/22/2016   Procedure: CYSTOSCOPY WITH STENT PLACEMENT;  Surgeon: Hollice Espy, MD;  Location: [facility];  Service: Urology;  Laterality: Right;   URETEROSCOPY WITH HOLMIUM LASER LITHOTRIPSY Right 01/07/2017   Procedure: URETEROSCOPY  WITH HOLMIUM LASER LITHOTRIPSY;  Surgeon: Hollice Espy, MD;  Location: [facility];  Service: Urology;  Laterality: Right;    Home Medications:  Allergies as of 07/04/2021       Reactions   Sulfa Antibiotics Rash, Other (See Comments)   Could've been caused by sun exposure.   Vicodin [hydrocodone-acetaminophen] Rash        Medication List        Accurate as of July 04, 2021 10:22 AM. If you have any questions, ask your nurse or doctor.          ascorbic acid 1000 MG tablet Commonly known as: VITAMIN C Take by mouth.   atorvastatin 10 MG tablet Commonly known as: LIPITOR Take 10 mg by mouth daily.   carvedilol 6.25 MG tablet Commonly known as: COREG Take 6.25 mg by mouth 2 (two) times daily with a meal.   gabapentin 300 MG capsule Commonly known as: NEURONTIN Take 300 mg by mouth daily.   NON FORMULARY Patient reports taking Mega Red otc   sertraline 50 MG tablet Commonly known as: ZOLOFT Take by mouth.   spironolactone 25 MG tablet Commonly known as: ALDACTONE Take 25 mg by mouth daily.   valsartan 80 MG tablet Commonly known as: DIOVAN Take 1 tablet by mouth daily.   Vitamin D-1000 Max St 25 MCG (1000 UT) tablet Generic drug: Cholecalciferol Take by mouth.        Allergies:  Allergies  Allergen Reactions   Sulfa Antibiotics Rash and  Other (See Comments)    Could've been caused by sun exposure.   Vicodin [Hydrocodone-Acetaminophen] Rash    Family History: Family History  Problem Relation Age of Onset   HIV Mother    Brain cancer Father    Breast cancer Maternal Grandmother    Heart Problems Maternal Grandfather     Social History:  reports that she has quit smoking. She has quit using smokeless tobacco. She reports current drug use. Drug: Marijuana. She reports that she does not drink alcohol.   Physical Exam: BP 112/76   Pulse 66   Ht 5' 6.5" (1.689 m)   Wt 169 lb (76.7 kg)   BMI 26.87 kg/m   Constitutional:  Alert and  oriented, No acute distress. HEENT: [place] AT, moist mucus membranes.  Trachea midline, no masses. Cardiovascular: No clubbing, cyanosis, or edema. Respiratory: Normal respiratory effort, no increased work of breathing. Skin: No rashes, bruises or suspicious lesions. Neurologic: Grossly intact, no focal deficits, moving all 4 extremities. Psychiatric: Normal mood and affect.  Laboratory Data:  Lab Results  Component Value Date   CREATININE 0.90 04/19/2020     Pertinent Imaging: CLINICAL DATA:  Renal mass.   EXAM: RENAL / URINARY TRACT ULTRASOUND COMPLETE   COMPARISON:  Renal ultrasound dated 11/24/2019.   FINDINGS: Right Kidney:   Renal measurements: 9.7 x 5.2 x 5.4 cm = volume: 141 mL. Normal echogenicity. No hydronephrosis or shadowing stone. There is a 1 cm lower pole cyst. A 1 cm hypoechoic lesion in the upper pole of the right kidney is similar to prior ultrasound.   Left Kidney:   Renal measurements: 9.9 x 5.8 x 5.7 cm = volume: 170 mL. Normal echogenicity. No hydronephrosis or shadowing stone. There is a 1.2 x 1.6 x 2.1 cm echogenic lesion in the interpolar aspect the left kidney which may represent an angiomyolipoma or interspersed fat. This is similar to prior ultrasound. Additional small cysts noted.   Bladder:   Appears normal for degree of bladder distention.   Other:   None.   IMPRESSION: 1. No interval change in the appearance of the bilateral renal lesions compared to prior ultrasound. Continued follow-up as clinically indicated. 2. No hydronephrosis or shadowing stone.     Electronically Signed   By: Anner Crete M.D.   On: 06/10/2021 03:34   I have personally reviewed the images and agree with radiologist interpretation.    Assessment & Plan:    Left renal mass / AML  - Stable since at least 2015 renal ultrasound shows unchanged size of this region  - Stable echoic structure from previous imaging measuring 1.3 cm  -consider repeat  imaging in 2 years, increasing interval given stability  2. History of nephrolithiasis  - no significant stone burden appreciated   - Given overall stability return for RUS in 2 years.  - We discussed general stone prevention techniques including drinking plenty water with goal of producing 2.5 L urine daily, increased citric acid intake, avoidance of high oxalate containing foods, and decreased salt intake.  Information about dietary recommendations given today.  - Letter to provider written today to work allowing her to have access to water due to medical considition   Follow-up in 2 years with RUS   I,Kailey Littlejohn,acting as a scribe for Hollice Espy, MD.,have documented all relevant documentation on the behalf of Hollice Espy, MD,as directed by  Hollice Espy, MD while in the presence of Hollice Espy, MD.  I have reviewed the above documentation for  accuracy and completeness, and I agree with the above.   Hollice Espy, MD   [facility] [address] [place], [place] [address] [phone]

## 2021-07-04 ENCOUNTER — Other Ambulatory Visit: Payer: Self-pay

## 2021-07-04 ENCOUNTER — Ambulatory Visit (INDEPENDENT_AMBULATORY_CARE_PROVIDER_SITE_OTHER): Payer: 59 | Admitting: Urology

## 2021-07-04 ENCOUNTER — Encounter: Payer: Self-pay | Admitting: Urology

## 2021-07-04 VITALS — BP 112/76 | HR 66 | Ht 66.5 in | Wt 169.0 lb

## 2021-07-04 DIAGNOSIS — N2889 Other specified disorders of kidney and ureter: Secondary | ICD-10-CM

## 2021-07-04 DIAGNOSIS — N200 Calculus of kidney: Secondary | ICD-10-CM | POA: Diagnosis not present

## 2021-09-19 ENCOUNTER — Other Ambulatory Visit: Payer: Self-pay | Admitting: Internal Medicine

## 2021-09-19 DIAGNOSIS — Z1231 Encounter for screening mammogram for malignant neoplasm of breast: Secondary | ICD-10-CM

## 2022-01-07 DIAGNOSIS — E782 Mixed hyperlipidemia: Secondary | ICD-10-CM | POA: Diagnosis not present

## 2022-01-07 DIAGNOSIS — R69 Illness, unspecified: Secondary | ICD-10-CM | POA: Diagnosis not present

## 2022-01-07 DIAGNOSIS — I420 Dilated cardiomyopathy: Secondary | ICD-10-CM | POA: Diagnosis not present

## 2022-01-07 DIAGNOSIS — R319 Hematuria, unspecified: Secondary | ICD-10-CM | POA: Diagnosis not present

## 2022-01-07 DIAGNOSIS — N390 Urinary tract infection, site not specified: Secondary | ICD-10-CM | POA: Diagnosis not present

## 2022-03-04 ENCOUNTER — Ambulatory Visit
Admission: RE | Admit: 2022-03-04 | Discharge: 2022-03-04 | Disposition: A | Discharge status: Home / Self Care | Payer: 59 | Source: Ambulatory Visit | Attending: Internal Medicine | Admitting: Internal Medicine

## 2022-03-04 DIAGNOSIS — Z1231 Encounter for screening mammogram for malignant neoplasm of breast: Secondary | ICD-10-CM | POA: Diagnosis not present

## 2022-03-06 ENCOUNTER — Other Ambulatory Visit: Payer: Self-pay | Admitting: Internal Medicine

## 2022-03-06 DIAGNOSIS — N63 Unspecified lump in unspecified breast: Secondary | ICD-10-CM

## 2022-03-06 DIAGNOSIS — R928 Other abnormal and inconclusive findings on diagnostic imaging of breast: Secondary | ICD-10-CM

## 2022-03-27 ENCOUNTER — Ambulatory Visit
Admission: RE | Admit: 2022-03-27 | Discharge: 2022-03-27 | Disposition: A | Discharge status: Home / Self Care | Payer: 59 | Source: Ambulatory Visit | Attending: Internal Medicine | Admitting: Internal Medicine

## 2022-03-27 DIAGNOSIS — N63 Unspecified lump in unspecified breast: Secondary | ICD-10-CM

## 2022-03-27 DIAGNOSIS — N6489 Other specified disorders of breast: Secondary | ICD-10-CM | POA: Diagnosis not present

## 2022-03-27 DIAGNOSIS — R928 Other abnormal and inconclusive findings on diagnostic imaging of breast: Secondary | ICD-10-CM | POA: Insufficient documentation

## 2022-03-27 DIAGNOSIS — Z1211 Encounter for screening for malignant neoplasm of colon: Secondary | ICD-10-CM | POA: Diagnosis not present

## 2022-04-08 LAB — EXTERNAL GENERIC LAB PROCEDURE: COLOGUARD: NEGATIVE

## 2022-05-20 DIAGNOSIS — I420 Dilated cardiomyopathy: Secondary | ICD-10-CM | POA: Diagnosis not present

## 2022-05-20 DIAGNOSIS — I10 Essential (primary) hypertension: Secondary | ICD-10-CM | POA: Diagnosis not present

## 2022-05-20 DIAGNOSIS — R0989 Other specified symptoms and signs involving the circulatory and respiratory systems: Secondary | ICD-10-CM | POA: Diagnosis not present

## 2022-05-20 DIAGNOSIS — E782 Mixed hyperlipidemia: Secondary | ICD-10-CM | POA: Diagnosis not present

## 2022-05-20 DIAGNOSIS — R002 Palpitations: Secondary | ICD-10-CM | POA: Diagnosis not present

## 2022-06-19 DIAGNOSIS — I420 Dilated cardiomyopathy: Secondary | ICD-10-CM | POA: Diagnosis not present

## 2022-06-19 DIAGNOSIS — H938X2 Other specified disorders of left ear: Secondary | ICD-10-CM | POA: Diagnosis not present

## 2022-06-19 DIAGNOSIS — R69 Illness, unspecified: Secondary | ICD-10-CM | POA: Diagnosis not present

## 2022-06-19 DIAGNOSIS — H9313 Tinnitus, bilateral: Secondary | ICD-10-CM | POA: Diagnosis not present

## 2022-08-26 DIAGNOSIS — H02831 Dermatochalasis of right upper eyelid: Secondary | ICD-10-CM | POA: Diagnosis not present

## 2023-07-17 ENCOUNTER — Telehealth: Payer: Self-pay | Admitting: Urology

## 2023-07-17 NOTE — Telephone Encounter (Signed)
Patient scheduled a 2 year follow up appointment. Would like to know if she needs any imaging or labs before coming in.

## 2023-07-20 ENCOUNTER — Other Ambulatory Visit: Payer: Self-pay

## 2023-07-20 DIAGNOSIS — N2889 Other specified disorders of kidney and ureter: Secondary | ICD-10-CM

## 2023-07-20 NOTE — Progress Notes (Signed)
Ultrasound order placed. Patient advised we need to have this done prior to her appointment. Patient states she will have [employer] in effect as of 09/30/23, advised patient when she schedules [place] to ask them to schedule for 09/30/23 or after but before follow up appointment so we can review those results.

## 2023-07-20 NOTE — Telephone Encounter (Signed)
Per my last note, f/u 2 weeks with RUS

## 2023-10-05 ENCOUNTER — Ambulatory Visit
Admission: RE | Admit: 2023-10-05 | Discharge: 2023-10-05 | Disposition: A | Discharge status: Home / Self Care | Payer: 59 | Source: Ambulatory Visit | Attending: Urology | Admitting: Urology

## 2023-10-05 DIAGNOSIS — N2889 Other specified disorders of kidney and ureter: Secondary | ICD-10-CM | POA: Diagnosis not present

## 2023-10-05 DIAGNOSIS — N281 Cyst of kidney, acquired: Secondary | ICD-10-CM | POA: Diagnosis not present

## 2023-10-09 DIAGNOSIS — R0602 Shortness of breath: Secondary | ICD-10-CM | POA: Diagnosis not present

## 2023-10-09 DIAGNOSIS — I700 Atherosclerosis of aorta: Secondary | ICD-10-CM | POA: Diagnosis not present

## 2023-10-09 DIAGNOSIS — E782 Mixed hyperlipidemia: Secondary | ICD-10-CM | POA: Diagnosis not present

## 2023-10-09 DIAGNOSIS — I10 Essential (primary) hypertension: Secondary | ICD-10-CM | POA: Diagnosis not present

## 2023-10-09 DIAGNOSIS — I420 Dilated cardiomyopathy: Secondary | ICD-10-CM | POA: Diagnosis not present

## 2023-10-13 ENCOUNTER — Ambulatory Visit (INDEPENDENT_AMBULATORY_CARE_PROVIDER_SITE_OTHER): Payer: Self-pay | Admitting: Urology

## 2023-10-13 ENCOUNTER — Encounter: Payer: Self-pay | Admitting: Urology

## 2023-10-13 VITALS — BP 140/80 | HR 77 | Ht 67.0 in | Wt 170.0 lb

## 2023-10-13 DIAGNOSIS — N3941 Urge incontinence: Secondary | ICD-10-CM

## 2023-10-13 DIAGNOSIS — R350 Frequency of micturition: Secondary | ICD-10-CM

## 2023-10-13 DIAGNOSIS — N2889 Other specified disorders of kidney and ureter: Secondary | ICD-10-CM | POA: Diagnosis not present

## 2023-10-13 DIAGNOSIS — Z87442 Personal history of urinary calculi: Secondary | ICD-10-CM | POA: Diagnosis not present

## 2023-10-13 DIAGNOSIS — R3129 Other microscopic hematuria: Secondary | ICD-10-CM

## 2023-10-13 LAB — MICROSCOPIC EXAMINATION

## 2023-10-13 LAB — URINALYSIS, COMPLETE
Bilirubin, UA: NEGATIVE
Glucose, UA: NEGATIVE
Ketones, UA: NEGATIVE
Leukocytes,UA: NEGATIVE
Nitrite, UA: NEGATIVE
Protein,UA: NEGATIVE
Specific Gravity, UA: 1.025 (ref 1.005–1.030)
Urobilinogen, Ur: 0.2 mg/dL (ref 0.2–1.0)
pH, UA: 5.5 (ref 5.0–7.5)

## 2023-10-13 MED ORDER — GEMTESA 75 MG PO TABS: 1.0000 | ORAL_TABLET | Freq: Every day | ORAL | Status: DC

## 2023-10-13 NOTE — Progress Notes (Signed)
 I,Amy L Pierron,acting as a scribe for Rosina Riis, MD.,have documented all relevant documentation on the behalf of Rosina Riis, MD,as directed by  Rosina Riis, MD while in the presence of Rosina Riis, MD.  10/13/2023 3:47 PM   Kim Riley 1961-05-11 [id number]  Referring provider: Sherial Bail, MD [address] [place],  [place] [address]  Chief Complaint  Patient presents with   Results    HPI: 63 year-old female with a personal history of renal lesions (felt to be likely benign) presents today for follow-up with renal ultrasound.  She was last seen a little over two years ago.   Renal ultrasound shows a similar 2 cm left sided hypoechoic mass. She has two separate lesions on the right measuring 1.2 and 1.4.   UA today shows microscopic blood, 3-10 red blood cells per high powered field.   She reports some urinary leakage if she can't get the bathroom soon enough or when getting out of her truck. She regularly wears liners for this. She mentions not even being aware she is leaking.   She mostly drinks only water. She gets dry mouth and feels a little light headed if she forgets to drink some.  She reports having occasional sharp pains that seem similar to pain she had with kidney stones before.   PMH: Past Medical History:  Diagnosis Date   Allergy    Anxiety    Cardiomyopathy ([facility])    Hypertension    Kidney stone    PONV (postoperative nausea and vomiting)    HARD TO WAKE UP 20 YEARS AGO FOR KIDNEY STONE-NO PROBLEM WITH 12-22-16 SURGERY    Surgical History: Past Surgical History:  Procedure Laterality Date   APPENDECTOMY     CYSTOSCOPY W/ RETROGRADES Right 12/22/2016   Procedure: CYSTOSCOPY WITH RETROGRADE PYELOGRAM;  Surgeon: Rosina Riis, MD;  Location: [facility];  Service: Urology;  Laterality: Right;   CYSTOSCOPY W/ URETERAL STENT PLACEMENT Right 01/07/2017   Procedure: CYSTOSCOPY WITH STENT REPLACEMENT;  Surgeon: Rosina Riis,  MD;  Location: [facility];  Service: Urology;  Laterality: Right;   CYSTOSCOPY WITH STENT PLACEMENT Right 12/22/2016   Procedure: CYSTOSCOPY WITH STENT PLACEMENT;  Surgeon: Rosina Riis, MD;  Location: [facility];  Service: Urology;  Laterality: Right;   URETEROSCOPY WITH HOLMIUM LASER LITHOTRIPSY Right 01/07/2017   Procedure: URETEROSCOPY WITH HOLMIUM LASER LITHOTRIPSY;  Surgeon: Rosina Riis, MD;  Location: [facility];  Service: Urology;  Laterality: Right;    Home Medications:  Allergies as of 10/13/2023       Reactions   Sulfa Antibiotics Rash, Other (See Comments)   Could've been caused by sun exposure.   Vicodin [hydrocodone-acetaminophen ] Rash        Medication List        Accurate as of October 13, 2023  3:47 PM. If you have any questions, ask your nurse or doctor.          STOP taking these medications    gabapentin 300 MG capsule Commonly known as: NEURONTIN Stopped by: Rosina Riis       TAKE these medications    ascorbic acid 1000 MG tablet Commonly known as: VITAMIN C Take by mouth.   atorvastatin 10 MG tablet Commonly known as: LIPITOR Take 10 mg by mouth daily.   carvedilol  6.25 MG tablet Commonly known as: COREG  Take 6.25 mg by mouth 2 (two) times daily with a meal.   Gemtesa  75 MG Tabs Generic drug: Vibegron  Take 1 tablet (75 mg total)  by mouth daily. Started by: Rosina Riis   NON FORMULARY Patient reports taking Mega Red otc   sertraline 50 MG tablet Commonly known as: ZOLOFT Take by mouth.   spironolactone 25 MG tablet Commonly known as: ALDACTONE Take 25 mg by mouth daily.   valsartan 80 MG tablet Commonly known as: DIOVAN Take 1 tablet by mouth daily.   Vitamin D-1000 Max St 25 MCG (1000 UT) tablet Generic drug: Cholecalciferol Take by mouth.        Allergies:  Allergies  Allergen Reactions   Sulfa Antibiotics Rash and Other (See Comments)    Could've been caused by sun exposure.   Vicodin  [Hydrocodone-Acetaminophen ] Rash    Family History: Family History  Problem Relation Age of Onset   HIV Mother    Brain cancer Father    Breast cancer Maternal Grandmother    Heart Problems Maternal Grandfather     Social History:  reports that she has quit smoking. She has quit using smokeless tobacco. She reports current drug use. Drug: Marijuana. She reports that she does not drink alcohol.   Physical Exam: BP (!) 140/80   Pulse 77   Ht 5' 7 (1.702 m)   Wt 170 lb (77.1 kg)   BMI 26.63 kg/m   Constitutional:  Alert and oriented, No acute distress. HEENT:  AT, moist mucus membranes.  Trachea midline, no masses. Neurologic: Grossly intact, no focal deficits, moving all 4 extremities. Psychiatric: Normal mood and affect.  Urinalysis Results for orders placed or performed in visit on 10/13/23  Microscopic Examination   Collection Time: 10/13/23 11:04 AM   Urine  Result Value Ref Range   WBC, UA 0-5 0 - 5 /hpf   RBC, Urine 3-10 (A) 0 - 2 /hpf   Epithelial Cells (non renal) 0-10 0 - 10 /hpf   Mucus, UA Present (A) Not Estab.   Bacteria, UA Few None seen/Few  Urinalysis, Complete   Collection Time: 10/13/23 11:04 AM  Result Value Ref Range   Specific Gravity, UA 1.025 1.005 - 1.030   pH, UA 5.5 5.0 - 7.5   Color, UA Yellow Yellow   Appearance Ur Clear Clear   Leukocytes,UA Negative Negative   Protein,UA Negative Negative/Trace   Glucose, UA Negative Negative   Ketones, UA Negative Negative   RBC, UA 2+ (A) Negative   Bilirubin, UA Negative Negative   Urobilinogen, Ur 0.2 0.2 - 1.0 mg/dL   Nitrite, UA Negative Negative   Microscopic Examination See below:     Pertinent Imaging: Results for orders placed during the hospital encounter of 10/05/23  Ultrasound renal complete  Narrative CLINICAL DATA:  Follow-up renal lesion  EXAM: RENAL / URINARY TRACT ULTRASOUND COMPLETE  COMPARISON:  Renal ultrasound 06/06/2021  FINDINGS: Right Kidney:  Renal  measurements: 9.3 x 4.2 x 4.5 cm = volume: 91 mL. Normal renal cortical thickness and echogenicity. No hydronephrosis. Similar-appearing 1.2 cm partially exophytic cyst lower pole right kidney. Similar 1.4 cm hypoechoic lesion superior pole.  Left Kidney:  Renal measurements: 10.5 x 5.7 x 5.3 cm = volume: 167 ML. Normal renal cortical thickness and echogenicity. There is a 2.3 cm cyst. There is a similar appearing 2 cm echogenic mass midpole left kidney. There is a stable 2.1 cm cyst midpole left kidney.  Bladder:  Appears normal for degree of bladder distention.  Other:  None.  IMPRESSION: Similar-appearing hypoechoic lesion superior pole right kidney. Recommend continued attention on follow-up. Recommend follow-up renal ultrasound in 6 months.  Similar  left renal angiomyolipoma.  Electronically Signed By: Bard Moats M.D. On: 10/05/2023 22:18 Personally reviewed the above scan, compared with previous imaging, and agree with radiologic interpretation.   Assessment & Plan:    1. Renal lesions  - Minimal change in last 2 years. Recommend imaging again in another 2 years.  -Likely benign, chronic  2. History of kidney stones  - No current stone burden.   3. Microscopic blood  - We discussed the differential diagnosis for microscopic hematuria including nephrolithiasis, renal or upper tract tumors, bladder stones, UTIs, or bladder tumors as well as undetermined etiologies. Per AUA guidelines, I did recommend complete microscopic hematuria evaluation including office cystoscopy. Willing to proceed with having this done. Available next month to do it. -Will hold off on additional upper tract imaging given that she just had a renal ultrasound  4. Urge incontinence  - Slightly bothersome. Interested in trying medication. Samples of Myrbetriq or Gemtesa   75 m given. Will reach out via MyChart on how it is working.  Return in about 4 weeks (around 11/10/2023) for  cystoscopy.   I have reviewed the above documentation for accuracy and completeness, and I agree with the above.   Rosina Riis, MD   [facility] [address], [address] [place], [place] [address] [phone]

## 2023-11-10 DIAGNOSIS — Z0000 Encounter for general adult medical examination without abnormal findings: Secondary | ICD-10-CM | POA: Diagnosis not present

## 2023-11-10 DIAGNOSIS — E782 Mixed hyperlipidemia: Secondary | ICD-10-CM | POA: Diagnosis not present

## 2023-11-10 DIAGNOSIS — E559 Vitamin D deficiency, unspecified: Secondary | ICD-10-CM | POA: Diagnosis not present

## 2023-11-10 DIAGNOSIS — R946 Abnormal results of thyroid function studies: Secondary | ICD-10-CM | POA: Diagnosis not present

## 2023-11-13 ENCOUNTER — Other Ambulatory Visit: Payer: Self-pay | Admitting: Urology

## 2023-11-17 DIAGNOSIS — Z0000 Encounter for general adult medical examination without abnormal findings: Secondary | ICD-10-CM | POA: Diagnosis not present

## 2023-11-17 DIAGNOSIS — I420 Dilated cardiomyopathy: Secondary | ICD-10-CM | POA: Diagnosis not present

## 2023-11-17 DIAGNOSIS — E538 Deficiency of other specified B group vitamins: Secondary | ICD-10-CM | POA: Diagnosis not present

## 2023-11-17 DIAGNOSIS — E559 Vitamin D deficiency, unspecified: Secondary | ICD-10-CM | POA: Diagnosis not present

## 2023-11-17 DIAGNOSIS — Z1231 Encounter for screening mammogram for malignant neoplasm of breast: Secondary | ICD-10-CM | POA: Diagnosis not present

## 2023-11-17 DIAGNOSIS — Z780 Asymptomatic menopausal state: Secondary | ICD-10-CM | POA: Diagnosis not present

## 2023-12-05 DIAGNOSIS — E785 Hyperlipidemia, unspecified: Secondary | ICD-10-CM | POA: Diagnosis not present

## 2023-12-05 DIAGNOSIS — F329 Major depressive disorder, single episode, unspecified: Secondary | ICD-10-CM | POA: Diagnosis not present

## 2023-12-05 DIAGNOSIS — N3941 Urge incontinence: Secondary | ICD-10-CM | POA: Diagnosis not present

## 2023-12-05 DIAGNOSIS — F419 Anxiety disorder, unspecified: Secondary | ICD-10-CM | POA: Diagnosis not present

## 2023-12-05 DIAGNOSIS — I10 Essential (primary) hypertension: Secondary | ICD-10-CM | POA: Diagnosis not present

## 2023-12-08 ENCOUNTER — Other Ambulatory Visit: Payer: Self-pay | Admitting: Internal Medicine

## 2023-12-08 DIAGNOSIS — Z1231 Encounter for screening mammogram for malignant neoplasm of breast: Secondary | ICD-10-CM

## 2023-12-23 ENCOUNTER — Ambulatory Visit (INDEPENDENT_AMBULATORY_CARE_PROVIDER_SITE_OTHER): Payer: 59 | Admitting: Urology

## 2023-12-23 ENCOUNTER — Other Ambulatory Visit: Payer: Self-pay | Admitting: Urology

## 2023-12-23 VITALS — BP 110/74 | HR 73 | Ht 67.5 in | Wt 170.0 lb

## 2023-12-23 DIAGNOSIS — R3129 Other microscopic hematuria: Secondary | ICD-10-CM

## 2023-12-23 DIAGNOSIS — N2889 Other specified disorders of kidney and ureter: Secondary | ICD-10-CM

## 2023-12-23 DIAGNOSIS — R350 Frequency of micturition: Secondary | ICD-10-CM

## 2023-12-23 LAB — URINALYSIS, COMPLETE
Bilirubin, UA: NEGATIVE
Glucose, UA: NEGATIVE
Ketones, UA: NEGATIVE
Leukocytes,UA: NEGATIVE
Nitrite, UA: NEGATIVE
Protein,UA: NEGATIVE
Specific Gravity, UA: 1.025 (ref 1.005–1.030)
Urobilinogen, Ur: 0.2 mg/dL (ref 0.2–1.0)
pH, UA: 6.5 (ref 5.0–7.5)

## 2023-12-23 LAB — MICROSCOPIC EXAMINATION: Bacteria, UA: NONE SEEN

## 2023-12-23 MED ORDER — GEMTESA 75 MG PO TABS: 1.0000 | ORAL_TABLET | Freq: Every day | ORAL | 11 refills | Status: AC

## 2023-12-23 NOTE — Telephone Encounter (Signed)
 Trospium, Oxybutinin, and Tolterodine are on the formulary for the patient per computer system. Myrbetriq and Leslye Peer are not.

## 2023-12-23 NOTE — Progress Notes (Addendum)
   12/23/23  CC:  Chief Complaint  Patient presents with   Cysto    HPI: 63 yo F with history of microscopic hematuria who presents today for cystoscopy.  See previous notes for details.  She had a renal ultrasound markable.  Blood pressure 110/74, pulse 73, height 5' 7.5" (1.715 m), weight 170 lb (77.1 kg). NED. A&Ox3.   No respiratory distress   Abd soft, NT, ND Normal external genitalia with patent urethral meatus  Cystoscopy Procedure Note  Patient identification was confirmed, informed consent was obtained, and patient was prepped using Betadine solution.  Lidocaine jelly was administered per urethral meatus.    Procedure: - Flexible cystoscope introduced, without any difficulty.   - Thorough search of the bladder revealed:    normal urethral meatus    normal urothelium    no stones    no ulcers     no tumors    no urethral polyps    no trabeculation  - Ureteral orifices were normal in position and appearance.  Post-Procedure: - Patient tolerated the procedure well  Assessment/ Plan:  1. Other microscopic hematuria (Primary) Microscopic hematuria present again today.  Cystoscopy is unremarkable.  Reassuring. - Urinalysis, Complete - Vibegron (GEMTESA) 75 MG TABS; Take 1 tablet (75 mg total) by mouth daily.  Dispense: 30 tablet; Refill: 11  2. Urinary frequency Did well on Gemtesa samples, changed to Myrbetriq by her PCP but reports that she is having esophageal burning at times and thinks this may be related with her Gemtesa.  Concerned this may not be on her formulary.  She will call us and let us know, if not we can try an alternative. - Vibegron (GEMTESA) 75 MG TABS; Take 1 tablet (75 mg total) by mouth daily.  Dispense: 30 tablet; Refill:   F/u annually with UA/ PVR or sooner as needed  Vanna Scotland, MD

## 2024-01-21 ENCOUNTER — Ambulatory Visit
Admission: RE | Admit: 2024-01-21 | Discharge: 2024-01-21 | Disposition: A | Discharge status: Home / Self Care | Source: Ambulatory Visit | Attending: Internal Medicine | Admitting: Internal Medicine

## 2024-01-21 DIAGNOSIS — Z1231 Encounter for screening mammogram for malignant neoplasm of breast: Secondary | ICD-10-CM | POA: Diagnosis not present

## 2024-02-10 DIAGNOSIS — R0602 Shortness of breath: Secondary | ICD-10-CM | POA: Diagnosis not present

## 2024-02-10 DIAGNOSIS — I420 Dilated cardiomyopathy: Secondary | ICD-10-CM | POA: Diagnosis not present

## 2024-05-23 DIAGNOSIS — E559 Vitamin D deficiency, unspecified: Secondary | ICD-10-CM | POA: Diagnosis not present

## 2024-05-23 DIAGNOSIS — E782 Mixed hyperlipidemia: Secondary | ICD-10-CM | POA: Diagnosis not present

## 2024-05-23 DIAGNOSIS — N201 Calculus of ureter: Secondary | ICD-10-CM | POA: Diagnosis not present

## 2024-05-23 DIAGNOSIS — E538 Deficiency of other specified B group vitamins: Secondary | ICD-10-CM | POA: Diagnosis not present

## 2024-05-23 DIAGNOSIS — I420 Dilated cardiomyopathy: Secondary | ICD-10-CM | POA: Diagnosis not present

## 2024-12-20 ENCOUNTER — Ambulatory Visit: Admitting: Physician Assistant
